# Patient Record
Sex: Female | Born: 1955 | Race: White | Hispanic: No | Marital: Married | Smoking: Never smoker
Health system: Southern US, Community
[De-identification: ages and names within clinical notes are randomized; demographics above are authoritative.]

## PROBLEM LIST (undated history)

## (undated) DIAGNOSIS — Z800 Family history of malignant neoplasm of digestive organs: Secondary | ICD-10-CM

## (undated) DIAGNOSIS — Z803 Family history of malignant neoplasm of breast: Secondary | ICD-10-CM

## (undated) DIAGNOSIS — Z801 Family history of malignant neoplasm of trachea, bronchus and lung: Secondary | ICD-10-CM

## (undated) DIAGNOSIS — C801 Malignant (primary) neoplasm, unspecified: Secondary | ICD-10-CM

## (undated) DIAGNOSIS — I10 Essential (primary) hypertension: Secondary | ICD-10-CM

## (undated) DIAGNOSIS — E785 Hyperlipidemia, unspecified: Secondary | ICD-10-CM

## (undated) DIAGNOSIS — F419 Anxiety disorder, unspecified: Secondary | ICD-10-CM

## (undated) HISTORY — DX: Family history of malignant neoplasm of breast: Z80.3

## (undated) HISTORY — DX: Family history of malignant neoplasm of digestive organs: Z80.0

## (undated) HISTORY — PX: CATARACT EXTRACTION, BILATERAL: SHX1313

## (undated) HISTORY — DX: Essential (primary) hypertension: I10

## (undated) HISTORY — DX: Family history of malignant neoplasm of trachea, bronchus and lung: Z80.1

## (undated) HISTORY — PX: ANTERIOR CRUCIATE LIGAMENT (ACL) REVISION: SHX6707

## (undated) HISTORY — PX: CHOLECYSTECTOMY: SHX55

---

## 2009-07-29 ENCOUNTER — Encounter: Admission: RE | Admit: 2009-07-29 | Discharge: 2009-07-29 | Payer: Self-pay | Admitting: Obstetrics

## 2010-03-21 ENCOUNTER — Emergency Department (HOSPITAL_COMMUNITY): Admission: EM | Admit: 2010-03-21 | Discharge: 2010-03-21 | Payer: Self-pay | Admitting: Family Medicine

## 2011-06-14 ENCOUNTER — Ambulatory Visit (HOSPITAL_BASED_OUTPATIENT_CLINIC_OR_DEPARTMENT_OTHER): Admission: RE | Admit: 2011-06-14 | Payer: Self-pay | Source: Ambulatory Visit | Admitting: Orthopedic Surgery

## 2011-12-08 ENCOUNTER — Other Ambulatory Visit: Payer: Self-pay | Admitting: Obstetrics

## 2011-12-08 DIAGNOSIS — Z1231 Encounter for screening mammogram for malignant neoplasm of breast: Secondary | ICD-10-CM

## 2012-01-02 ENCOUNTER — Ambulatory Visit: Payer: Self-pay

## 2014-02-05 ENCOUNTER — Other Ambulatory Visit: Payer: Self-pay

## 2014-02-05 DIAGNOSIS — Z1231 Encounter for screening mammogram for malignant neoplasm of breast: Secondary | ICD-10-CM

## 2014-02-23 ENCOUNTER — Ambulatory Visit
Admission: RE | Admit: 2014-02-23 | Discharge: 2014-02-23 | Disposition: A | Discharge status: Home / Self Care | Payer: 59 | Source: Ambulatory Visit

## 2014-02-23 DIAGNOSIS — Z1231 Encounter for screening mammogram for malignant neoplasm of breast: Secondary | ICD-10-CM

## 2015-06-07 ENCOUNTER — Telehealth: Payer: Self-pay | Admitting: Cardiovascular Disease

## 2015-06-07 NOTE — Telephone Encounter (Signed)
Received records from [facility] for appointment on 06/29/15 with Dr Duke Salvia.  Records given to [facility] (medical records) for Dr Leonides Sake schedule on 06/29/15.  lp

## 2015-06-29 ENCOUNTER — Ambulatory Visit: Payer: Self-pay | Admitting: Cardiovascular Disease

## 2015-07-14 ENCOUNTER — Ambulatory Visit: Payer: Self-pay | Admitting: Cardiovascular Disease

## 2016-06-03 ENCOUNTER — Telehealth: Payer: 59 | Admitting: Physician Assistant

## 2016-06-03 DIAGNOSIS — J019 Acute sinusitis, unspecified: Secondary | ICD-10-CM

## 2016-06-03 DIAGNOSIS — B9689 Other specified bacterial agents as the cause of diseases classified elsewhere: Secondary | ICD-10-CM

## 2016-06-03 MED ORDER — DOXYCYCLINE HYCLATE 100 MG PO CAPS: 100.0000 mg | ORAL_CAPSULE | Freq: Two times a day (BID) | ORAL | 0 refills | Status: DC

## 2016-06-03 NOTE — Progress Notes (Signed)

## 2016-06-23 ENCOUNTER — Telehealth: Payer: 59 | Admitting: Family

## 2016-06-23 DIAGNOSIS — B9689 Other specified bacterial agents as the cause of diseases classified elsewhere: Secondary | ICD-10-CM

## 2016-06-23 DIAGNOSIS — J028 Acute pharyngitis due to other specified organisms: Secondary | ICD-10-CM

## 2016-06-23 MED ORDER — AZITHROMYCIN 250 MG PO TABS: ORAL_TABLET | ORAL | 0 refills | Status: DC

## 2016-06-23 NOTE — Progress Notes (Signed)

## 2016-08-09 ENCOUNTER — Telehealth: Payer: 59 | Admitting: Nurse Practitioner

## 2016-08-09 DIAGNOSIS — R059 Cough, unspecified: Secondary | ICD-10-CM

## 2016-08-09 DIAGNOSIS — R05 Cough: Secondary | ICD-10-CM

## 2016-08-09 MED ORDER — AZITHROMYCIN 250 MG PO TABS: ORAL_TABLET | ORAL | 0 refills | Status: DC

## 2016-08-09 NOTE — Progress Notes (Signed)

## 2017-03-13 ENCOUNTER — Ambulatory Visit: Payer: 59 | Admitting: Podiatry

## 2017-04-18 ENCOUNTER — Telehealth: Payer: 59 | Admitting: Nurse Practitioner

## 2017-04-18 DIAGNOSIS — J019 Acute sinusitis, unspecified: Secondary | ICD-10-CM

## 2017-04-18 DIAGNOSIS — J0100 Acute maxillary sinusitis, unspecified: Secondary | ICD-10-CM

## 2017-04-18 DIAGNOSIS — J029 Acute pharyngitis, unspecified: Secondary | ICD-10-CM

## 2017-04-18 DIAGNOSIS — B9689 Other specified bacterial agents as the cause of diseases classified elsewhere: Secondary | ICD-10-CM

## 2017-04-18 MED ORDER — DOXYCYCLINE HYCLATE 100 MG PO CAPS: 100.0000 mg | ORAL_CAPSULE | Freq: Two times a day (BID) | ORAL | 0 refills | Status: DC

## 2017-04-18 NOTE — Addendum Note (Signed)
Addended by: Bennie PieriniMARTIN, MARY-MARGARET on: 04/18/2017 10:29 AM   Modules accepted: Orders

## 2017-04-18 NOTE — Progress Notes (Signed)
So sorry , but according to questionnaire it seemed you were concerned about sore throat. Below you will see care for sinusitis.  We are sorry that you are not feeling well.  Here is how we plan to help!  Based on what you have shared with me it looks like you have sinusitis.  Sinusitis is inflammation and infection in the sinus cavities of the head.  Based on your presentation I believe you most likely have Acute Bacterial Sinusitis.  This is an infection caused by bacteria and is treated with antibiotics. I have prescribed Doxycycline 100mg  by mouth twice a day for 10 days. You may use an oral decongestant such as Mucinex D or if you have glaucoma or high blood pressure use plain Mucinex. Saline nasal spray help and can safely be used as often as needed for congestion.  If you develop worsening sinus pain, fever or notice severe headache and vision changes, or if symptoms are not better after completion of antibiotic, please schedule an appointment with a health care provider.    Sinus infections are not as easily transmitted as other respiratory infection, however we still recommend that you avoid close contact with loved ones, especially the very young and elderly.  Remember to wash your hands thoroughly throughout the day as this is the number one way to prevent the spread of infection!  Home Care:  Only take medications as instructed by your medical team.  Complete the entire course of an antibiotic.  Do not take these medications with alcohol.  A steam or ultrasonic humidifier can help congestion.  You can place a towel over your head and breathe in the steam from hot water coming from a faucet.  Avoid close contacts especially the very young and the elderly.  Cover your mouth when you cough or sneeze.  Always remember to wash your hands.  Get Help Right Away If:  You develop worsening fever or sinus pain.  You develop a severe head ache or visual changes.  Your symptoms persist  after you have completed your treatment plan.  Make sure you  Understand these instructions.  Will watch your condition.  Will get help right away if you are not doing well or get worse.  Your e-visit answers were reviewed by a board certified advanced clinical practitioner to complete your personal care plan.  Depending on the condition, your plan could have included both over the counter or prescription medications.  If there is a problem please reply  once you have received a response from your provider.  Your safety is important to us.  If you have drug allergies check your prescription carefully.    You can use MyChart to ask questions about today's visit, request a non-urgent call back, or ask for a work or school excuse for 24 hours related to this e-Visit. If it has been greater than 24 hours you will need to follow up with your provider, or enter a new e-Visit to address those concerns.  You will get an e-mail in the next two days asking about your experience.  I hope that your e-visit has been valuable and will speed your recovery. Thank you for using e-visits.

## 2017-04-18 NOTE — Progress Notes (Signed)
We are sorry that you are not feeling well.  Here is how we plan to help!  Based on what you have shared with me it looks like you have  pharyngitis.   pharyngitis is inflammation  in the back of the throat.  This is an infection caused by a virus and will just run its course. Please keep in mind that this could last up to 10 days. You may use an oral throat lozenges as needed. Strep infections are not as easily transmitted as other respiratory infection, however we still recommend that you avoid close contact with loved ones, especially the very young and elderly.  Remember to wash your hands thoroughly throughout the day as this is the number one way to prevent the spread of infection!  Providers prescribe antibiotics to treat infections caused by bacteria. Antibiotics are very powerful in treating bacterial infections when they are used properly. To maintain their effectiveness, they should be used only when necessary. Overuse of antibiotics has resulted in the development of superbugs that are resistant to treatment!    After careful review of your answers, I would not recommend an antibiotic for your condition.  Antibiotics are not effective against viruses and therefore should not be used to treat them. Common examples of infections caused by viruses include colds and flu   Home Care:  Only take medications as instructed by your medical team.  Complete the entire course of an antibiotic.  Do not take these medications with alcohol.  A steam or ultrasonic humidifier can help congestion.  You can place a towel over your head and breathe in the steam from hot water coming from a faucet.  Avoid close contacts especially the very young and the elderly.  Cover your mouth when you cough or sneeze.  Always remember to wash your hands.  Get Help Right Away If:  You develop worsening fever or sinus pain.  You develop a severe head ache or visual changes.  Your symptoms persist after you  have completed your treatment plan.  Make sure you  Understand these instructions.  Will watch your condition.  Will get help right away if you are not doing well or get worse.  Your e-visit answers were reviewed by a board certified advanced clinical practitioner to complete your personal care plan.  Depending on the condition, your plan could have included both over the counter or prescription medications.  If there is a problem please reply  once you have received a response from your provider.  Your safety is important to us.  If you have drug allergies check your prescription carefully.    You can use MyChart to ask questions about today's visit, request a non-urgent call back, or ask for a work or school excuse for 24 hours related to this e-Visit. If it has been greater than 24 hours you will need to follow up with your provider, or enter a new e-Visit to address those concerns.  You will get an e-mail in the next two days asking about your experience.  I hope that your e-visit has been valuable and will speed your recovery. Thank you for using e-visits.             

## 2017-07-11 ENCOUNTER — Encounter: Payer: Self-pay | Admitting: Family Medicine

## 2018-09-05 ENCOUNTER — Telehealth: Payer: 59 | Admitting: Physician Assistant

## 2018-09-05 DIAGNOSIS — J029 Acute pharyngitis, unspecified: Secondary | ICD-10-CM

## 2018-09-05 MED ORDER — PREDNISONE 20 MG PO TABS: 40.0000 mg | ORAL_TABLET | Freq: Every day | ORAL | 0 refills | Status: DC

## 2018-09-05 MED ORDER — AZITHROMYCIN 250 MG PO TABS: ORAL_TABLET | ORAL | 0 refills | Status: DC

## 2018-09-05 NOTE — Progress Notes (Signed)
Addendum:   Multiple messages back and forth with patient concerning request for antibiotic. Additional symptoms expressed by patient with review of her history of respiratory infections. Did provider z-pack with instructions on monitoring symptoms for 48 hours and only to start abx if symptoms continue to worsen or are not starting to ease up some in that time frame.

## 2018-09-05 NOTE — Progress Notes (Signed)
We are sorry that you are not feeling well.  Here is how we plan to help!  Your symptoms indicate a likely viral infection (Pharyngitis).   Pharyngitis is inflammation in the back of the throat which can cause a sore throat, scratchiness and sometimes difficulty swallowing.   Pharyngitis is typically caused by a respiratory virus and will just run its course.  Please keep in mind that your symptoms could last up to 10 days.  For throat pain, we recommend over the counter oral pain relief medications such as acetaminophen. Giving severity of pain I am sending in a small burst of steroid to alleviate pain and swelling. Topical treatments such as oral throat lozenges or sprays may be used as needed.  Avoid close contact with loved ones, especially the very young and elderly.  Remember to wash your hands thoroughly throughout the day as this is the number one way to prevent the spread of infection and wipe down door knobs and counters with disinfectants.  After careful review of your answers, I would not recommend and antibiotic for your condition.  Antibiotics should not be used to treat conditions that we suspect are caused by viruses like the virus that causes the common cold or flu. However, some people can have Strep with atypical symptoms. You may need formal testing in clinic or office to confirm if your symptoms continue or worsen.  Providers prescribe antibiotics to treat infections caused by bacteria. Antibiotics are very powerful in treating bacterial infections when they are used properly.  To maintain their effectiveness, they should be used only when necessary.  Overuse of antibiotics has resulted in the development of super bugs that are resistant to treatment!    Home Care:  Only take medications as instructed by your medical team.  Do not drink alcohol while taking these medications.  A steam or ultrasonic humidifier can help congestion.  You can place a towel over your head and breathe in  the steam from hot water coming from a faucet.  Avoid close contacts especially the very young and the elderly.  Cover your mouth when you cough or sneeze.  Always remember to wash your hands.  Get Help Right Away If:  You develop worsening fever or throat pain.  You develop a severe head ache or visual changes.  Your symptoms persist after you have completed your treatment plan.  Make sure you  Understand these instructions.  Will watch your condition.  Will get help right away if you are not doing well or get worse.  Your e-visit answers were reviewed by a board certified advanced clinical practitioner to complete your personal care plan.  Depending on the condition, your plan could have included both over the counter or prescription medications.  If there is a problem please reply  once you have received a response from your provider.  Your safety is important to us.  If you have drug allergies check your prescription carefully.    You can use MyChart to ask questions about todays visit, request a non-urgent call back, or ask for a work or school excuse for 24 hours related to this e-Visit. If it has been greater than 24 hours you will need to follow up with your provider, or enter a new e-Visit to address those concerns.  You will get an e-mail in the next two days asking about your experience.  I hope that your e-visit has been valuable and will speed your recovery. Thank you for using e-visits.

## 2018-09-05 NOTE — Addendum Note (Signed)
Addended by: Waldon MerlMARTIN, Eyal Greenhaw C on: 09/05/2018 07:46 PM   Modules accepted: Orders

## 2019-12-15 ENCOUNTER — Other Ambulatory Visit: Payer: Self-pay | Admitting: Orthopedic Surgery

## 2019-12-15 DIAGNOSIS — M25562 Pain in left knee: Secondary | ICD-10-CM

## 2019-12-18 ENCOUNTER — Ambulatory Visit
Admission: RE | Admit: 2019-12-18 | Discharge: 2019-12-18 | Disposition: A | Discharge status: Home / Self Care | Payer: Medicare Other | Source: Ambulatory Visit | Attending: Orthopedic Surgery | Admitting: Orthopedic Surgery

## 2019-12-18 ENCOUNTER — Other Ambulatory Visit: Payer: Self-pay

## 2019-12-18 DIAGNOSIS — M25562 Pain in left knee: Secondary | ICD-10-CM

## 2020-09-15 ENCOUNTER — Other Ambulatory Visit: Payer: Self-pay | Admitting: Family Medicine

## 2020-09-15 DIAGNOSIS — M25562 Pain in left knee: Secondary | ICD-10-CM

## 2020-09-16 ENCOUNTER — Ambulatory Visit
Admission: RE | Admit: 2020-09-16 | Discharge: 2020-09-16 | Disposition: A | Discharge status: Home / Self Care | Payer: Medicare Other | Source: Ambulatory Visit | Attending: Family Medicine | Admitting: Family Medicine

## 2020-09-16 DIAGNOSIS — M25562 Pain in left knee: Secondary | ICD-10-CM

## 2021-04-21 LAB — COLOGUARD: COLOGUARD: NEGATIVE

## 2022-01-22 IMAGING — MR MR KNEE*L* W/O CM
4 of 6 series · 23 of 40 positions shown · non-contrast
Comparison: MRI left knee 12/18/2019.

CLINICAL DATA: Medial left knee pain for 1 year. History of
multiple falls.

EXAM:
MRI OF THE LEFT KNEE WITHOUT CONTRAST
TECHNIQUE: Multiplanar, multisequence MR imaging of the knee was performed. No
intravenous contrast was administered.

[Series 4: T2 fat-sat · coronal · 4.0mm · 0.59mm/px · 6 of 29 slices shown (1 of 2)]
[im 1/29]
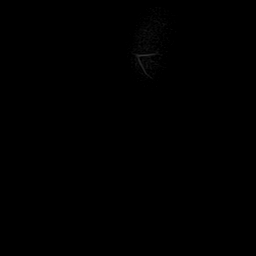
[im 6/29]
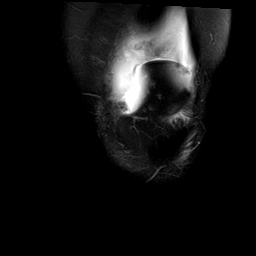
[im 12/29]
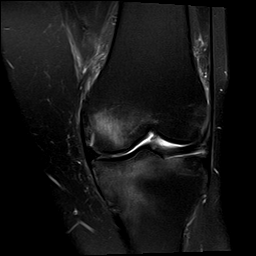
[im 17/29]
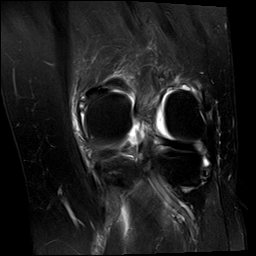
[im 23/29]
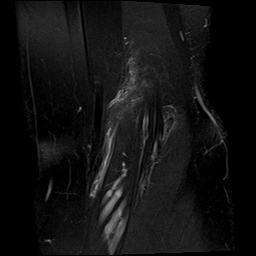
[im 29/29]
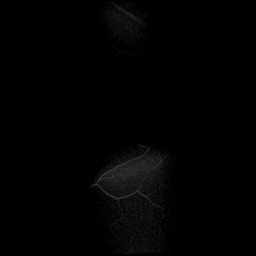

[Series 5: T1 · coronal · 4.0mm · 0.29mm/px · 3 of 29 slices shown]
[im 6/29]
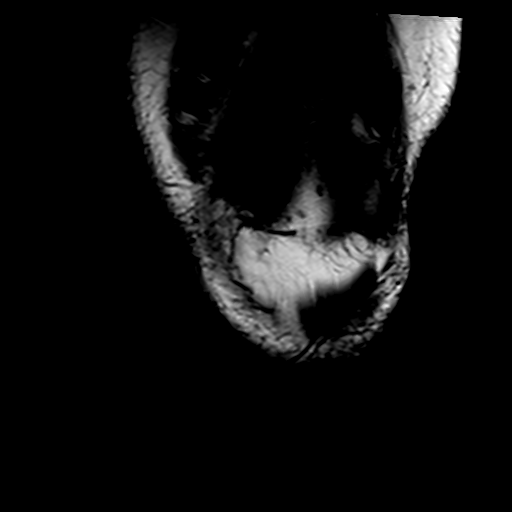
[im 17/29]
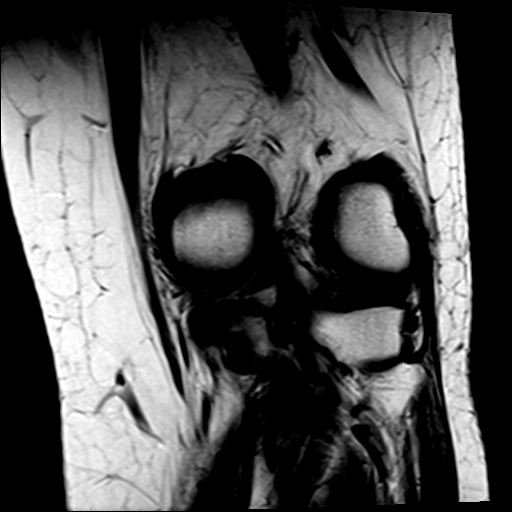
[im 29/29]
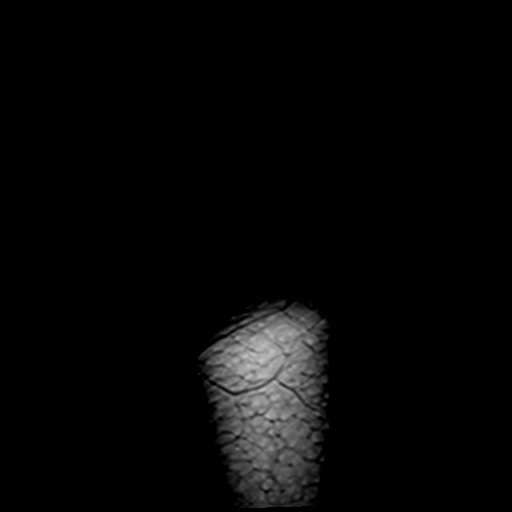

[Series 7: PD fat-sat · sagittal · 3.0mm · 0.29mm/px · 7 of 30 slices shown]
[im 1/30]
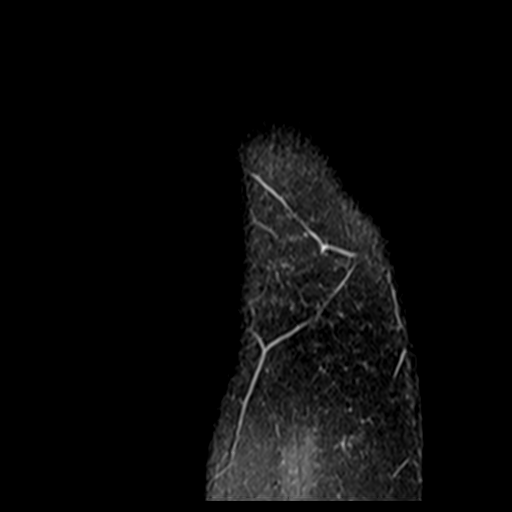
[im 5/30]
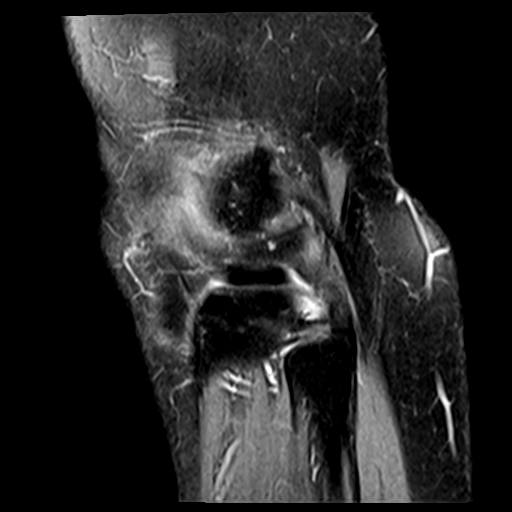
[im 10/30]
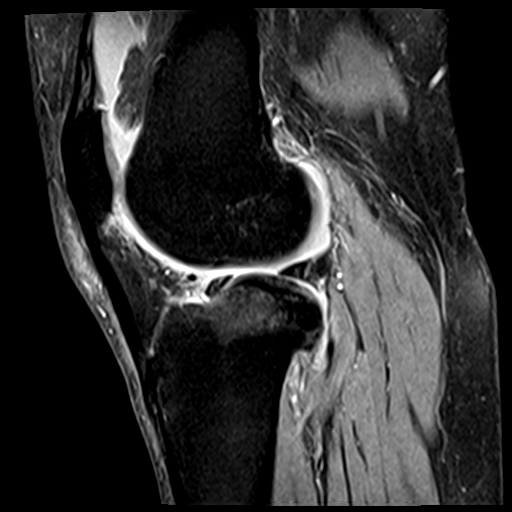
[im 15/30]
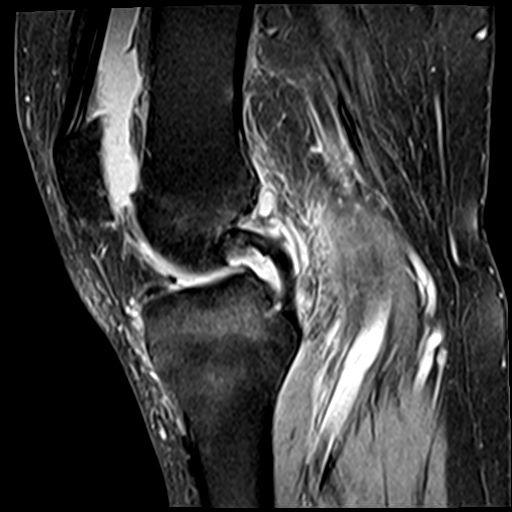
[im 20/30]
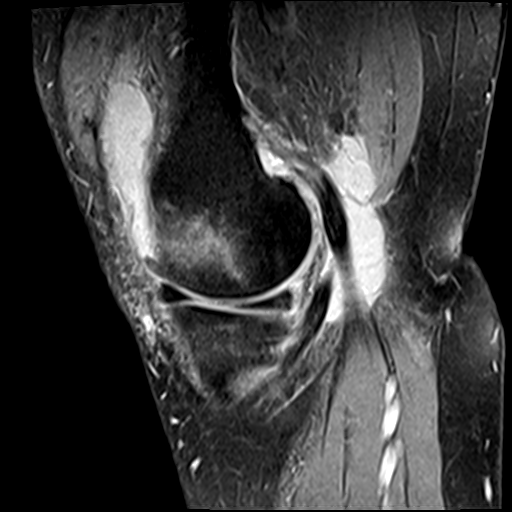
[im 25/30]
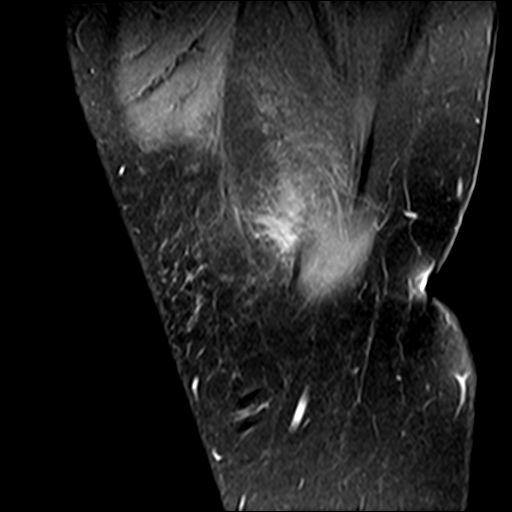
[im 30/30]
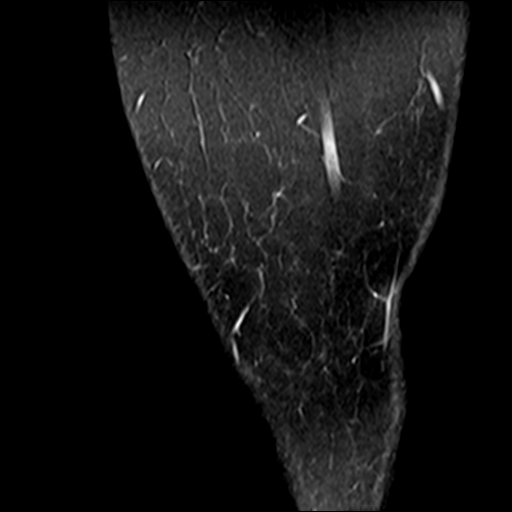

[Series 8: T2 fat-sat · sagittal · 3.0mm · 0.29mm/px · 7 of 30 slices shown (2 of 2)]
[im 1/30]
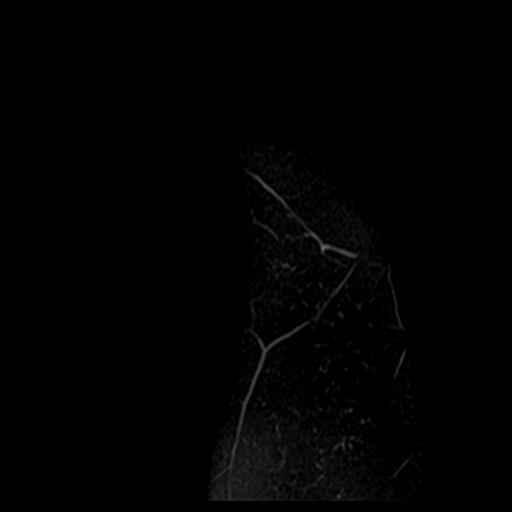
[im 5/30]
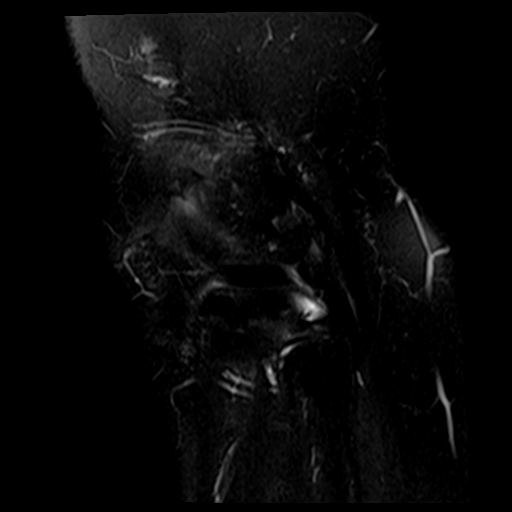
[im 10/30]
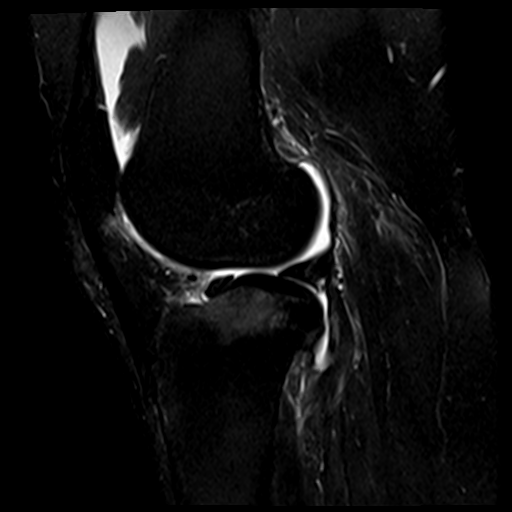
[im 15/30]
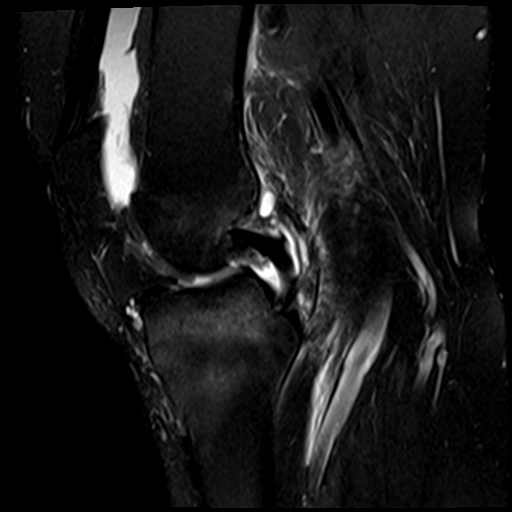
[im 20/30]
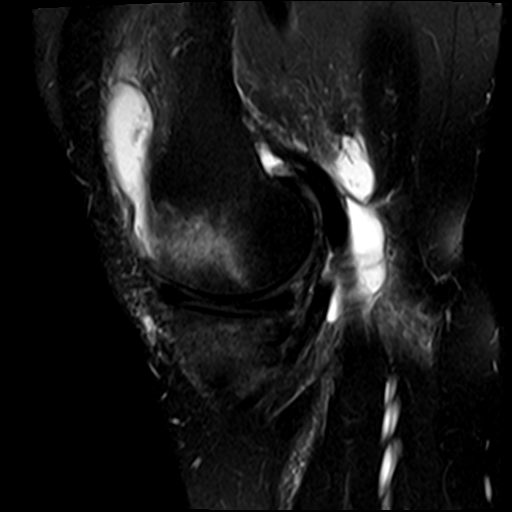
[im 25/30]
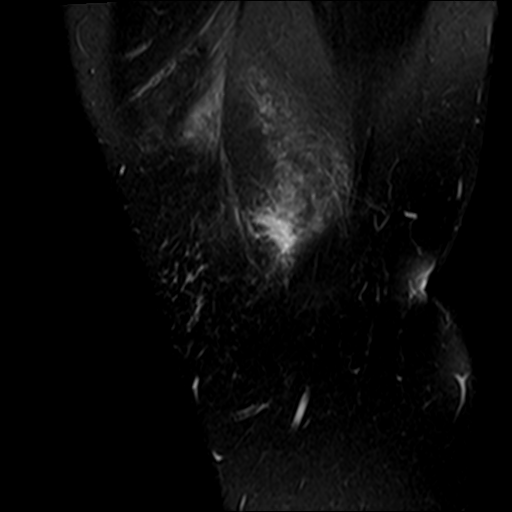
[im 30/30]
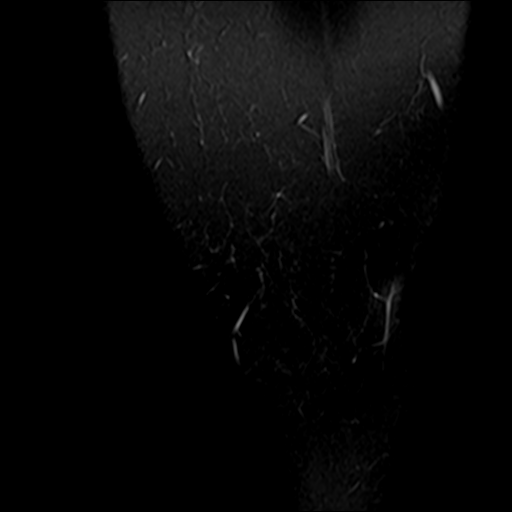

[23 of 40 positions shown; findings below may reference images not displayed]

FINDINGS: MENISCI

Medial meniscus: Since the prior MRI, the patient has developed a
radial tear in the posterior horn with a gap between meniscal
fragments of approximately 0.7 cm. The root of the posterior horn is
edematous but appears intact.

Lateral meniscus:  Intact.

LIGAMENTS

Cruciates:  Intact.

Collaterals:  Intact.

CARTILAGE

Patellofemoral: Again seen is denuding of hyaline cartilage along
the medial patellar facet and femoral trochlea.

Medial: Cartilage loss has markedly progressed since the prior MRI.
There is a full-thickness defect along the central weight-bearing
medial femoral condyle measuring 0.7 cm transverse by 0.7 cm AP.

Lateral:  Mildly degenerated.

Joint:  Moderate joint effusion.

Popliteal Fossa: Baker's cyst measures 3 cm transverse by 1 cm AP by
5.5 cm craniocaudal compared to 2 cm transverse by 0.9 cm AP by
cm craniocaudal on the prior exam.

Extensor Mechanism:  Intact.

Bones: There is new subchondral edema about the knee which is worst
in the medial compartment. Small incomplete and nondisplaced stress
fracture in the medial tibial metaphysis is new since the prior
study.

Other: None.
IMPRESSION: New large radial tear in the posterior horn of the medial meniscus.

New subchondral edema about the medial and lateral compartments is
worse on the medial side. The patient has an incomplete and
nondisplaced stress fracture of the medial tibial metaphysis.

Worsened osteoarthritis in the medial compartment.

Baker's cyst seen on the prior study has increased in size.

## 2022-08-04 NOTE — Progress Notes (Unsigned)
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  Not associated with increased frequency.  Neurologic exam unremarkable.   History of cerebral meningioma resection Hypertension  No further workup recommended. Contact PCP's today office regarding blood pressure   Subjective:  Katelyn Rich is a 66 year old right-handed female with hyperlipidemia who presents for atypical headache and meningoma.  History supplemented by referring provider's note.  She has had ocular migraines since she was around 66 years old.  They present as either a blind spot in her vision or seeing zigzag lines.  Usually lasts a couple of minutes.  No associated headache.  She would typically take a Fioricet.  Initially they would occur once a week or maybe once a month.  As she got older, they became less frequent, occurring every few years.  About 3 weeks ago, she had an ocular migraine with the blind spot.  The next morning, she had another one, presenting with the zigzag lines.  She never had one back to back.  She hasn't had another one since then.  At the time, she was taking tramadol for knee pain.  She looked it up and saw headaches may be a side effect, so she stopped the tramadol.    She has history of meningioma resection around 2008 (found incidentally on brain imaging after she hit her head).  Last available MRI of brain with and without contrast on 07/09/2014 revealed left frontal craniotomy with minimal postsurgical changes but no evidence of residual or recurrent meningioma.  Eventually, her neurosurgeon told her further repeat MRIs no longer indicated.    Past NSAIDS/analgesics:  tramadol PRN  Past abortive triptans:  none Past abortive ergotamine:  none Past muscle relaxants:   none Past anti-emetic:  none Past antihypertensive medications:  none Past antidepressant medications:  none Past anticonvulsant medications:  none Past anti-CGRP:  none Other past therapies:  none  Current NSAIDS/analgesics:  none Current triptans:  none Current ergotamine:  none Current anti-emetic:  none Current muscle relaxants:  none Current Antihypertensive medications:  amlodipine 2.5mg  daily, olmesartan Current Antidepressant medications:  none Current Anticonvulsant medications:  none Current anti-CGRP:  none Other medications:  alprazolam       PAST MEDICAL HISTORY: Past Medical History:  Diagnosis Date   Hypertension     PAST SURGICAL HISTORY: Meningioma resection  MEDICATIONS: Current Outpatient Medications on File Prior to Visit  Medication Sig Dispense Refill   ALPRAZolam (XANAX) 0.5 MG tablet Take 0.5 mg by mouth at bedtime as needed.     amLODipine (NORVASC) 2.5 MG tablet Take 1 tablet by mouth daily.     diclofenac Sodium (VOLTAREN) 1 % GEL Apply 2 g topically 4 (four) times daily.     olmesartan (BENICAR) 40 MG tablet Take 1 tablet by mouth daily.     traMADol (ULTRAM) 50 MG tablet Take 1 tablet by mouth every 8 (eight) hours as needed.     No current facility-administered medications on file prior to visit.      ALLERGIES: Allergies  Allergen Reactions   Penicillins Anaphylaxis    FAMILY HISTORY: No family history on file.  Objective:  Blood pressure (!) 191/95, pulse 61, height 5\' 5"  (1.651 m), weight 183 lb 12.8 oz (83.4 kg), SpO2 98 %. General: No acute distress.  Patient appears well-groomed.   Head:  Normocephalic/atraumatic Eyes:  fundi examined but not visualized Neck: supple, no paraspinal tenderness, full range of motion Back: No paraspinal tenderness Heart: regular rate and rhythm Lungs: Clear to auscultation bilaterally. Vascular: No carotid bruits. Neurological Exam: Mental status: alert and oriented to person, place,  and time, speech fluent and not dysarthric, language intact. Cranial nerves: CN I: not tested CN II: pupils equal, round and reactive to light, visual fields intact CN III, IV, VI:  full range of motion, no nystagmus, no ptosis CN V: facial sensation intact. CN VII: upper and lower face symmetric CN VIII: hearing intact CN IX, X: gag intact, uvula midline CN XI: sternocleidomastoid and trapezius muscles intact CN XII: tongue midline Bulk & Tone: normal, no fasciculations. Motor:  muscle strength 5/5 throughout Sensation:  Temperature and vibratory sensation intact. Deep Tendon Reflexes:  2+ throughout,  toes downgoing.   Finger to nose testing:  Without dysmetria.   Heel to shin:  Without dysmetria.   Gait:  Normal station and stride.  Romberg negative.    Thank you for allowing me to take part in the care of this patient.  Metta Clines, DO  CC: Shanon Rosser, PA-C

## 2022-08-07 ENCOUNTER — Encounter: Payer: Self-pay | Admitting: Neurology

## 2022-08-07 ENCOUNTER — Ambulatory Visit (INDEPENDENT_AMBULATORY_CARE_PROVIDER_SITE_OTHER): Payer: Medicare Other | Admitting: Neurology

## 2022-08-07 VITALS — BP 191/95 | HR 61 | Ht 65.0 in | Wt 183.8 lb

## 2022-08-07 DIAGNOSIS — Z9889 Other specified postprocedural states: Secondary | ICD-10-CM | POA: Diagnosis not present

## 2022-08-07 DIAGNOSIS — I10 Essential (primary) hypertension: Secondary | ICD-10-CM | POA: Diagnosis not present

## 2022-08-07 DIAGNOSIS — G43109 Migraine with aura, not intractable, without status migrainosus: Secondary | ICD-10-CM

## 2022-08-07 DIAGNOSIS — Z8603 Personal history of neoplasm of uncertain behavior: Secondary | ICD-10-CM

## 2024-06-01 ENCOUNTER — Emergency Department (HOSPITAL_BASED_OUTPATIENT_CLINIC_OR_DEPARTMENT_OTHER)
Admission: EM | Admit: 2024-06-01 | Discharge: 2024-06-01 | Disposition: A | Discharge status: Home / Self Care | Attending: Emergency Medicine | Admitting: Emergency Medicine

## 2024-06-01 ENCOUNTER — Emergency Department (HOSPITAL_BASED_OUTPATIENT_CLINIC_OR_DEPARTMENT_OTHER)

## 2024-06-01 ENCOUNTER — Other Ambulatory Visit: Payer: Self-pay

## 2024-06-01 ENCOUNTER — Encounter (HOSPITAL_BASED_OUTPATIENT_CLINIC_OR_DEPARTMENT_OTHER): Payer: Self-pay

## 2024-06-01 DIAGNOSIS — W19XXXA Unspecified fall, initial encounter: Secondary | ICD-10-CM

## 2024-06-01 DIAGNOSIS — W01198A Fall on same level from slipping, tripping and stumbling with subsequent striking against other object, initial encounter: Secondary | ICD-10-CM | POA: Insufficient documentation

## 2024-06-01 DIAGNOSIS — S0990XA Unspecified injury of head, initial encounter: Secondary | ICD-10-CM | POA: Diagnosis present

## 2024-06-01 DIAGNOSIS — S0101XA Laceration without foreign body of scalp, initial encounter: Secondary | ICD-10-CM | POA: Insufficient documentation

## 2024-06-01 MED ORDER — TETANUS-DIPHTH-ACELL PERTUSSIS 5-2.5-18.5 LF-MCG/0.5 IM SUSY
0.5000 mL | PREFILLED_SYRINGE | Freq: Once | INTRAMUSCULAR | Status: DC
  Filled 2024-06-01: qty 0.5

## 2024-06-01 MED ORDER — LIDOCAINE-EPINEPHRINE-TETRACAINE (LET) TOPICAL GEL
3.0000 mL | Freq: Once | TOPICAL | Status: AC
  Administered 2024-06-01: 3 mL via TOPICAL
  Filled 2024-06-01: qty 3

## 2024-06-01 NOTE — ED Triage Notes (Signed)
 Pt reports tripping and falling, striking her head. Pt denies any LOC. Pt denies any blood thinners. Pt has 2 cm laceration to scalp.

## 2024-06-01 NOTE — ED Provider Notes (Signed)
 [place] [facility]PARTMENT AT [facility] Provider Note   CSN: [id number] Arrival date & time: 06/01/24  1629     Patient presents with: Katelyn Rich   Katelyn Rich is a 68 y.o. female here for evaluation mechanical fall.  Tripped and fell hit the frontal aspect of her head.  Suffered a 2 cm laceration.  Her tetanus is up-to-date.  No nausea, vomiting, LOC, anticoagulation.  Was able to ambulate after fall.  No pain to neck, back, chest, abdomen, upper or lower extremities.   HPI     Prior to Admission medications   Medication Sig Start Date End Date Taking? Authorizing Provider  ALPRAZolam (XANAX) 0.5 MG tablet Take 0.5 mg by mouth at bedtime as needed.    [provider]  amLODipine (NORVASC) 2.5 MG tablet Take 1 tablet by mouth daily.    [provider]  diclofenac Sodium (VOLTAREN) 1 % GEL Apply 2 g topically 4 (four) times daily.    [provider]  olmesartan (BENICAR) 40 MG tablet Take 1 tablet by mouth daily.    [provider]  traMADol (ULTRAM) 50 MG tablet Take 1 tablet by mouth every 8 (eight) hours as needed.    [provider]    Allergies: Penicillins    Review of Systems  Constitutional: Negative.   HENT: Negative.    Respiratory: Negative.    Cardiovascular: Negative.   Gastrointestinal: Negative.   Genitourinary: Negative.   Musculoskeletal: Negative.   Skin:  Positive for wound.  Neurological: Negative.   All other systems reviewed and are negative.   Updated Vital Signs BP (!) 153/81 (BP Location: Right Arm)   Pulse 64   Temp 97.8 F (36.6 C) (Temporal)   Resp 18   Ht 5' 5 (1.651 m)   Wt 79.4 kg   SpO2 99%   BMI 29.12 kg/m   Physical Exam Vitals and nursing note reviewed.  Constitutional:      General: She is not in acute distress.    Appearance: She is well-developed. She is not ill-appearing, toxic-appearing or diaphoretic.  HENT:     Head: Normocephalic.     Jaw: There is normal  jaw occlusion.     Comments: No raccoon eyes, Battle sign, hematoma. 3 cm laceration just posterior to hairline on head.    Mouth/Throat:     Mouth: Mucous membranes are moist.  Eyes:     Pupils: Pupils are equal, round, and reactive to light.  Neck:     Trachea: Trachea and phonation normal.     Comments: No midline tenderness Cardiovascular:     Rate and Rhythm: Normal rate.     Pulses: Normal pulses.     Heart sounds: Normal heart sounds.  Pulmonary:     Effort: Pulmonary effort is normal. No respiratory distress.     Breath sounds: Normal breath sounds.  Abdominal:     General: Bowel sounds are normal. There is no distension.     Palpations: Abdomen is soft.  Musculoskeletal:        General: Normal range of motion.     Cervical back: Full passive range of motion without pain and normal range of motion.     Comments: Nontender upper or lower extremities, full range of motion, no midline C/T/L tenderness  Skin:    General: Skin is warm and dry.     Capillary Refill: Capillary refill takes less than 2 seconds.     Comments: 3 cm laceration scalp, no active  bleed  Neurological:     General: No focal deficit present.     Mental Status: She is alert.  Psychiatric:        Mood and Affect: Mood normal.     (all labs ordered are listed, but only abnormal results are displayed) Labs Reviewed - No data to display  EKG: None  Radiology: CT Head Wo Contrast Result Date: 06/01/2024 CLINICAL DATA:  Polytrauma, blunt EXAM: CT HEAD WITHOUT CONTRAST CT CERVICAL SPINE WITHOUT CONTRAST TECHNIQUE: Multidetector CT imaging of the head and cervical spine was performed following the standard protocol without intravenous contrast. Multiplanar CT image reconstructions of the cervical spine were also generated. RADIATION DOSE REDUCTION: This exam was performed according to the departmental dose-optimization program which includes automated exposure control, adjustment of the mA and/or kV  according to patient size and/or use of iterative reconstruction technique. COMPARISON:  None Available. FINDINGS: CT HEAD FINDINGS Brain: Left frontal lobe encephalomalacia. No evidence of large-territorial acute infarction. No parenchymal hemorrhage. No mass lesion. No extra-axial collection. No mass effect or midline shift. No hydrocephalus. Basilar cisterns are patent. Vascular: No hyperdense vessel. Skull: No acute fracture or focal lesion.  Bilateral craniotomy. Sinuses/Orbits: Paranasal sinuses and mastoid air cells are clear. Bilateral lens replacement. Otherwise the orbits are unremarkable. Other: None. CT CERVICAL SPINE FINDINGS Alignment: Normal. Skull base and vertebrae: Multilevel moderate degenerative change of the spine. No associated severe osseous neural foraminal or central canal stenosis. No acute fracture. No aggressive appearing focal osseous lesion or focal pathologic process. Soft tissues and spinal canal: No prevertebral fluid or swelling. No visible canal hematoma. Upper chest: Unremarkable. Other: Heterogeneous thyroid glands. IMPRESSION: 1. No acute intracranial abnormality. 2. No acute displaced fracture or traumatic listhesis of the cervical spine. Electronically Signed   By: Morgane  Naveau M.D.   On: 06/01/2024 17:56   CT Cervical Spine Wo Contrast Result Date: 06/01/2024 CLINICAL DATA:  Polytrauma, blunt EXAM: CT HEAD WITHOUT CONTRAST CT CERVICAL SPINE WITHOUT CONTRAST TECHNIQUE: Multidetector CT imaging of the head and cervical spine was performed following the standard protocol without intravenous contrast. Multiplanar CT image reconstructions of the cervical spine were also generated. RADIATION DOSE REDUCTION: This exam was performed according to the departmental dose-optimization program which includes automated exposure control, adjustment of the mA and/or kV according to patient size and/or use of iterative reconstruction technique. COMPARISON:  None Available. FINDINGS: CT  HEAD FINDINGS Brain: Left frontal lobe encephalomalacia. No evidence of large-territorial acute infarction. No parenchymal hemorrhage. No mass lesion. No extra-axial collection. No mass effect or midline shift. No hydrocephalus. Basilar cisterns are patent. Vascular: No hyperdense vessel. Skull: No acute fracture or focal lesion.  Bilateral craniotomy. Sinuses/Orbits: Paranasal sinuses and mastoid air cells are clear. Bilateral lens replacement. Otherwise the orbits are unremarkable. Other: None. CT CERVICAL SPINE FINDINGS Alignment: Normal. Skull base and vertebrae: Multilevel moderate degenerative change of the spine. No associated severe osseous neural foraminal or central canal stenosis. No acute fracture. No aggressive appearing focal osseous lesion or focal pathologic process. Soft tissues and spinal canal: No prevertebral fluid or swelling. No visible canal hematoma. Upper chest: Unremarkable. Other: Heterogeneous thyroid glands. IMPRESSION: 1. No acute intracranial abnormality. 2. No acute displaced fracture or traumatic listhesis of the cervical spine. Electronically Signed   By: Morgane  Naveau M.D.   On: 06/01/2024 17:56     .Laceration Repair  Date/Time: 06/01/2024 6:56 PM  Performed by: Edie Rosebud LABOR, PA-C Authorized by: Edie Rosebud LABOR, PA-C   Consent:  Consent obtained:  Verbal   Consent given by:  Patient   Risks, benefits, and alternatives were discussed: yes     Risks discussed:  Infection, pain, retained foreign body, tendon damage, vascular damage, poor wound healing, poor cosmetic result, need for additional repair and nerve damage   Alternatives discussed:  No treatment, delayed treatment, observation and referral Universal protocol:    Procedure explained and questions answered to patient or proxy's satisfaction: yes     Relevant documents present and verified: yes     Test results available: yes     Imaging studies available: yes     Required blood products,  implants, devices, and special equipment available: yes     Site/side marked: yes     Immediately prior to procedure, a time out was called: yes     Patient identity confirmed:  Verbally with patient Anesthesia:    Anesthesia method:  Topical application   Topical anesthetic:  LET Laceration details:    Location:  Scalp   Scalp location:  Frontal   Length (cm):  2   Depth (mm):  4 Pre-procedure details:    Preparation:  Patient was prepped and draped in usual sterile fashion and imaging obtained to evaluate for foreign bodies Exploration:    Limited defect created (wound extended): no     Hemostasis achieved with:  Direct pressure   Imaging obtained comment:  CT   Wound extent: fascia not violated, no foreign body, no signs of injury, no tendon damage, no underlying fracture and no vascular damage     Contaminated: no   Treatment:    Area cleansed with:  Soap and water Skin repair:    Repair method:  Staples   Number of staples:  2 Approximation:    Approximation:  Close Repair type:    Repair type:  Intermediate Post-procedure details:    Dressing:  Open (no dressing)   Procedure completion:  Tolerated well, no immediate complications    Medications Ordered in the ED  Tdap (BOOSTRIX ) injection 0.5 mL (0.5 mLs Intramuscular Patient Refused/Not Given 06/01/24 1809)  lidocaine -EPINEPHrine -tetracaine  (LET) topical gel (3 mLs Topical Given 06/01/24 3239)   68 year old here for evaluation after mechanical fall.  She has a 3 cm laceration to scalp.  No active bleeding.  No LOC or anticoagulation.  Amatory after fall.  Neurovascular intact.  Nontender upper lower extremities.  No syncope.  Will plan on imaging, closure and reassess  Imaging personally viewed and interpreted:  CT head, cervical no significant abnormality  Discussed results with patient, family in room.  Is able to place 2 staples for scalp wound.  Discussed close follow-up outpatient, return for any worsening  symptom  The patient has been appropriately medically screened and/or stabilized in the ED. I have low suspicion for any other emergent medical condition which would require further screening, evaluation or treatment in the ED or require inpatient management.  Patient is hemodynamically stable and in no acute distress.  Patient able to ambulate in department prior to ED.  Evaluation does not show acute pathology that would require ongoing or additional emergent interventions while in the emergency department or further inpatient treatment.  I have discussed the diagnosis with the patient and answered all questions.  Pain is been managed while in the emergency department and patient has no further complaints prior to discharge.  Patient is comfortable with plan discussed in room and is stable for discharge at this time.  I have discussed strict return precautions  for returning to the emergency department.  Patient was encouraged to follow-up with PCP/specialist refer to at discharge.                                    Medical Decision Making Amount and/or Complexity of Data Reviewed Independent Historian: spouse External Data Reviewed: labs, radiology and notes. Radiology: ordered and independent interpretation performed. Decision-making details documented in ED Course.  Risk OTC drugs. Prescription drug management. Decision regarding hospitalization. Diagnosis or treatment significantly limited by social determinants of health.       Final diagnoses:  Fall, initial encounter  Laceration of scalp, initial encounter    ED Discharge Orders     None          Aanyah Loa A, PA-C 06/01/24 2329    Ruthe Cornet, DO 06/04/24 1449

## 2024-06-01 NOTE — ED Notes (Signed)
 DC paperwork given and verbally understood.

## 2024-06-01 NOTE — Discharge Instructions (Signed)
 You have two staples in your head.  Follow-up in 1 week for removal.  Otherwise Tylenol Motrin as needed for pain  Do not wash your hair for 48 hours after that you may let warm soapy water run over the area  Return for new or worsening symptoms

## 2024-06-01 NOTE — ED Notes (Signed)
 Provider aware of the LET being given.

## 2024-06-11 ENCOUNTER — Other Ambulatory Visit: Payer: Self-pay | Admitting: Physician Assistant

## 2024-06-11 DIAGNOSIS — Z1231 Encounter for screening mammogram for malignant neoplasm of breast: Secondary | ICD-10-CM

## 2024-07-22 ENCOUNTER — Other Ambulatory Visit: Payer: Self-pay | Admitting: Obstetrics

## 2024-07-22 DIAGNOSIS — R928 Other abnormal and inconclusive findings on diagnostic imaging of breast: Secondary | ICD-10-CM

## 2024-08-02 ENCOUNTER — Other Ambulatory Visit: Payer: Self-pay | Admitting: Obstetrics

## 2024-08-02 ENCOUNTER — Ambulatory Visit
Admission: RE | Admit: 2024-08-02 | Discharge: 2024-08-02 | Disposition: A | Discharge status: Home / Self Care | Source: Ambulatory Visit | Attending: Obstetrics | Admitting: Obstetrics

## 2024-08-02 DIAGNOSIS — R928 Other abnormal and inconclusive findings on diagnostic imaging of breast: Secondary | ICD-10-CM

## 2024-08-02 DIAGNOSIS — N6311 Unspecified lump in the right breast, upper outer quadrant: Secondary | ICD-10-CM

## 2024-08-04 ENCOUNTER — Ambulatory Visit
Admission: RE | Admit: 2024-08-04 | Discharge: 2024-08-04 | Disposition: A | Discharge status: Home / Self Care | Source: Ambulatory Visit | Attending: Obstetrics | Admitting: Obstetrics

## 2024-08-04 DIAGNOSIS — N6311 Unspecified lump in the right breast, upper outer quadrant: Secondary | ICD-10-CM

## 2024-08-04 HISTORY — PX: BREAST BIOPSY: SHX20

## 2024-08-05 LAB — SURGICAL PATHOLOGY

## 2024-08-11 ENCOUNTER — Encounter: Payer: Self-pay | Admitting: *Deleted

## 2024-08-11 DIAGNOSIS — C50411 Malignant neoplasm of upper-outer quadrant of right female breast: Secondary | ICD-10-CM | POA: Insufficient documentation

## 2024-08-12 NOTE — Progress Notes (Signed)
 Radiation Oncology         [phone]) [phone] ________________________________  Name: Katelyn Rich        MRN: [id number]  Date of Service: 08/13/2024 DOB: 1956-04-10  RR:Onwh, Scott, PA-C  Vanderbilt Ned, MD     REFERRING PHYSICIAN: Vanderbilt Ned, MD   DIAGNOSIS: The encounter diagnosis was Malignant neoplasm of upper-outer quadrant of right breast in female, estrogen receptor positive (HCC). C50.411, Z17.0    HISTORY OF PRESENT ILLNESS: Katelyn Rich is a 68 y.o. female seen in consultation for radiation therapy.  Ms. Obed presented with a spiculated mass seen on screening mammography. Subsequent 08/02/24 unilateral diagnostic mammogram revealed a 0.6 x 0.6 x 0.4 cm mass w/ ill-defined borders at 10 o'clock in the UOQ of the R breast. A second suspicious part cystic 5 mm mass was seen at 11 o'clock. No abnormal lymph nodes seen within the R axilla. 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 The 11 o'clock lesion was negative for malignancy and consistent with fibrocystic changes w/ usual ductal hyperplasia.  Estrogen exposure history:  Childbearing/breastfeeding:   Family history of cancer:  PREVIOUS RADIATION THERAPY: {EXAM; YES/NO:19492::No}  AUTOIMMUNE DISEASE: {EXAM; YES/NO:19492::No}  MEDICAL DEVICES: {EXAM; YES/NO:19492::No}  PREGNANCY: {EXAM; YES/NO:19492::No}   PAST MEDICAL HISTORY:  Past Medical History:  Diagnosis Date   Hypertension        PAST SURGICAL HISTORY: Past Surgical History:  Procedure Laterality Date   ANTERIOR CRUCIATE LIGAMENT (ACL) REVISION     BREAST BIOPSY Right 08/04/2024   US  RT BREAST BX W LOC DEV 1ST LESION IMG BX SPEC US  GUIDE 08/04/2024 [facility]   BREAST BIOPSY Right 08/04/2024   US  RT BREAST BX W LOC DEV EA ADD LESION IMG BX SPEC US  GUIDE 08/04/2024 [facility]   CATARACT EXTRACTION, BILATERAL      CHOLECYSTECTOMY       FAMILY HISTORY:  Family History  Problem Relation Age of Onset   Dementia Mother    Migraines Daughter      SOCIAL HISTORY:  reports that she has never smoked. She has never used smokeless tobacco. She reports current alcohol use of about 4.0 standard drinks of alcohol per week. She reports that she does not use drugs.   ALLERGIES: Penicillins   MEDICATIONS:  Current Outpatient Medications  Medication Sig Dispense Refill   ALPRAZolam (XANAX) 0.5 MG tablet Take 0.5 mg by mouth at bedtime as needed.     amLODipine (NORVASC) 2.5 MG tablet Take 1 tablet by mouth daily.     diclofenac Sodium (VOLTAREN) 1 % GEL Apply 2 g topically 4 (four) times daily.     olmesartan (BENICAR) 40 MG tablet Take 1 tablet by mouth daily.     traMADol (ULTRAM) 50 MG tablet Take 1 tablet by mouth every 8 (eight) hours as needed.     No current facility-administered medications for this visit.     REVIEW OF SYSTEMS: The patient reports that s***/he is doing well overall and a review of symptoms is otherwise negative.      PHYSICAL EXAM:  Wt Readings from Last 3 Encounters:  06/01/24 175 lb (79.4 kg)  08/07/22 183 lb 12.8 oz (83.4 kg)   Temp Readings from Last 3 Encounters:  06/01/24 97.8 F (36.6 C) (Temporal)   BP Readings from Last 3 Encounters:  06/01/24 (!) 153/81  08/07/22 (!) 191/95   Pulse Readings from Last 3  Encounters:  06/01/24 64  08/07/22 61    /10   Physical Exam   Breast Exam:  Well-healed lumpectomy/re-excision scar in the *** quadrant. No erythema, induration, or drainage. No palpable masses or nodularity at the surgical site. No skin changes, nipple inversion, or discharge.   ECOG = ***   LABORATORY DATA:  No results found for: WBC, HGB, HCT, MCV, PLT No results found for: NA, K, CL, CO2 No results found for: ALT, AST, GGT, ALKPHOS, BILITOT    RADIOGRAPHY: US  RT BREAST BX W LOC DEV 1ST LESION IMG BX SPEC US   GUIDE Addendum Date: 08/05/2024 ADDENDUM REPORT: 08/05/2024 13:23 ADDENDUM: PATHOLOGY revealed: Site 1. Breast, right, needle core biopsy, 10 o'clock, 5 cmfn, ribbon- INVASIVE DUCTAL CARCINOMA, SEE NOTE DUCTAL CARCINOMA IN SITU, SOLID, INTERMEDIATE NUCLEAR GRADE, WITHOUT NECROSIS- OVERALL GRADE: 1. LYMPHOVASCULAR INVASION: NOT IDENTIFIED, CANCER LENGTH: 0.4 CM, CALCIFICATIONS: NOT IDENTIFIED. Pathology results are CONCORDANT with imaging findings, per Dr. Craig Farr. PATHOLOGY revealed: Site 2. Breast, right, needle core biopsy, 11 o'clock, 2 cmfn, coil- FIBROCYSTIC CHANGES WITH USUAL DUCTAL HYPERPLASIA. NEGATIVE FOR MALIGNANCY. Pathology results are CONCORDANT with imaging findings, per Dr. Craig Farr. Pathology results and recommendations below were discussed with patient by telephone on 08/05/2024 by Rock Hover RN. Patient reported biopsy site within normal limits with slight tenderness at the site. Post biopsy care instructions were reviewed, questions were answered and my direct phone number was provided to patient. Patient was instructed to call [facility] if any concerns or questions arise related to the biopsy. RECOMMENDATION: Surgical and oncological consultation for site 1 only. The patient was referred to [facility] with appointment on 08/13/24. Pathology results reported by Rock Hover RN on 08/05/2024. Electronically Signed   By: Craig Farr M.D.   On: 08/05/2024 13:23   Result Date: 08/05/2024 CLINICAL DATA:  Right breast masses for biopsy EXAM: ULTRASOUND GUIDED RIGHT BREAST CORE NEEDLE BIOPSY COMPARISON:  Previous exam(s). PROCEDURE: I met with the patient and we discussed the procedure of ultrasound-guided biopsy, including benefits and alternatives. We discussed the high likelihood of a successful procedure. We discussed the risks of the procedure, including infection, bleeding, tissue  injury, clip migration, and inadequate sampling. Informed written consent was given. The usual time-out protocol was performed immediately prior to the procedure. 1: Lesion quadrant: Upper-outer quadrant Using sterile technique and 1% Lidocaine  as local anesthetic, under direct ultrasound visualization, a 12 gauge spring-loaded device was used to perform biopsy of mass at right breast 10 o'clock using a lateral approach. At the conclusion of the procedure ribbon shaped tissue marker clip was deployed into the biopsy cavity. Follow up 2 view mammogram was performed and dictated separately. 2: Lesion quadrant: Upper-outer quadrant Using sterile technique and 1% Lidocaine  as local anesthetic, under direct ultrasound visualization, a 12 gauge spring-loaded device was used to perform biopsy of mass at right breast 11 o'clock using a lateral approach. At the conclusion of the procedure coil shaped tissue marker clip was deployed into the biopsy cavity. Follow up 2 view mammogram was performed and dictated separately. IMPRESSION: Ultrasound guided biopsies of right breast. No apparent complications. Electronically Signed: By: Craig Farr M.D. On: 08/04/2024 10:22   US  RT BREAST BX W LOC DEV EA ADD LESION IMG BX SPEC US  GUIDE Addendum Date: 08/05/2024 ADDENDUM REPORT: 08/05/2024 13:23 ADDENDUM: PATHOLOGY revealed: Site 1. Breast, right, needle core biopsy, 10 o'clock, 5 cmfn, ribbon- INVASIVE DUCTAL CARCINOMA,  SEE NOTE DUCTAL CARCINOMA IN SITU, SOLID, INTERMEDIATE NUCLEAR GRADE, WITHOUT NECROSIS- OVERALL GRADE: 1. LYMPHOVASCULAR INVASION: NOT IDENTIFIED, CANCER LENGTH: 0.4 CM, CALCIFICATIONS: NOT IDENTIFIED. Pathology results are CONCORDANT with imaging findings, per Dr. Craig Farr. PATHOLOGY revealed: Site 2. Breast, right, needle core biopsy, 11 o'clock, 2 cmfn, coil- FIBROCYSTIC CHANGES WITH USUAL DUCTAL HYPERPLASIA. NEGATIVE FOR MALIGNANCY. Pathology results are CONCORDANT with imaging findings, per Dr. Craig Farr. Pathology results and recommendations below were discussed with patient by telephone on 08/05/2024 by Rock Hover RN. Patient reported biopsy site within normal limits with slight tenderness at the site. Post biopsy care instructions were reviewed, questions were answered and my direct phone number was provided to patient. Patient was instructed to call [facility] if any concerns or questions arise related to the biopsy. RECOMMENDATION: Surgical and oncological consultation for site 1 only. The patient was referred to [facility] at [facility] with appointment on 08/13/24. Pathology results reported by Rock Hover RN on 08/05/2024. Electronically Signed   By: Craig Farr M.D.   On: 08/05/2024 13:23   Result Date: 08/05/2024 CLINICAL DATA:  Right breast masses for biopsy EXAM: ULTRASOUND GUIDED RIGHT BREAST CORE NEEDLE BIOPSY COMPARISON:  Previous exam(s). PROCEDURE: I met with the patient and we discussed the procedure of ultrasound-guided biopsy, including benefits and alternatives. We discussed the high likelihood of a successful procedure. We discussed the risks of the procedure, including infection, bleeding, tissue injury, clip migration, and inadequate sampling. Informed written consent was given. The usual time-out protocol was performed immediately prior to the procedure. 1: Lesion quadrant: Upper-outer quadrant Using sterile technique and 1% Lidocaine  as local anesthetic, under direct ultrasound visualization, a 12 gauge spring-loaded device was used to perform biopsy of mass at right breast 10 o'clock using a lateral approach. At the conclusion of the procedure ribbon shaped tissue marker clip was deployed into the biopsy cavity. Follow up 2 view mammogram was performed and dictated separately. 2: Lesion quadrant: Upper-outer quadrant Using sterile technique and 1% Lidocaine  as local anesthetic, under direct  ultrasound visualization, a 12 gauge spring-loaded device was used to perform biopsy of mass at right breast 11 o'clock using a lateral approach. At the conclusion of the procedure coil shaped tissue marker clip was deployed into the biopsy cavity. Follow up 2 view mammogram was performed and dictated separately. IMPRESSION: Ultrasound guided biopsies of right breast. No apparent complications. Electronically Signed: By: Craig Farr M.D. On: 08/04/2024 10:22   MM CLIP PLACEMENT RIGHT Result Date: 08/04/2024 CLINICAL DATA:  Status post ultrasound-guided core biopsies of right breast masses. EXAM: 3D DIAGNOSTIC RIGHT MAMMOGRAM POST ULTRASOUND BIOPSY COMPARISON:  Previous exam(s). ACR Breast Density Category b: There are scattered areas of fibroglandular density. FINDINGS: 1: 3D Mammographic images were obtained following ultrasound guided biopsy of mass at right breast 10 o'clock. The ribbon biopsy marking clip is in adjacent to the mass of concern. 2: 3D Mammographic images were obtained following ultrasound guided biopsy of mass at right breast 11 o'clock. The coil biopsy marking clip is in expected position at the site of biopsy. IMPRESSION: 1: Appropriate positioning of the ribbon shaped biopsy marking clip at the site of biopsyadjacent to the mass of concern right breast 10 o'clock. 2: Appropriate positioning of the coil shaped biopsy marking clip at the site of biopsy in the expected location of concern mass at right breast 11 o'clock. Final Assessment: Post Procedure Mammograms for Marker Placement Electronically Signed  By: Craig Farr M.D.   On: 08/04/2024 10:36   MM 3D DIAGNOSTIC MAMMOGRAM UNILATERAL RIGHT BREAST Result Date: 08/02/2024 CLINICAL DATA:  68 year old female recalled for spiculated mass in the upper outer right breast on recent screening mammogram. EXAM: DIGITAL DIAGNOSTIC UNILATERAL RIGHT MAMMOGRAM WITH TOMOSYNTHESIS AND CAD; ULTRASOUND RIGHT BREAST LIMITED TECHNIQUE: Right digital  diagnostic mammography and breast tomosynthesis was performed. The images were evaluated with computer-aided detection. ; Targeted ultrasound examination of the right breast was performed COMPARISON:  Previous exam(s). ACR Breast Density Category b: There are scattered areas of fibroglandular density. FINDINGS: Right breast mammogram: There is a 5 mm irregular mass with spiculated borders in the upper outer right breast middle depth 5 cm from the nipple. There is a second equal density oval 5 mm mass in the upper outer right breast anterior depth approximately 1 cm from the nipple. An ultrasound will be performed next for further evaluation of both areas. Targeted ultrasound the upper-outer right breast was performed which demonstrates in hypoechoic 0.6 x 0.6 x 0.4 cm mass with ill-defined borders in the right breast at 10 o'clock 5 cm from the nipple. This corresponds to the spiculated mass on mammography. In the right breast at 11 o'clock 2 cm from the nipple there is a slightly irregular 4 x 3 by 5 mm bilobed mass which is part anechoic part hypoechoic. Normal appearing lymph nodes in the right axilla. IMPRESSION: Highly suspicious 6 mm mass in the right breast 10 o'clock middle depth 5 cm from the nipple. Second suspicious part cystic 5 mm mass in the right breast 11 o'clock 2 cm from the nipple. RECOMMENDATION: Ultrasound-guided biopsy of both right breast masses is recommended I have discussed the findings and recommendations with the patient. If applicable, a reminder letter will be sent to the patient regarding the next appointment. BI-RADS CATEGORY  5: Highly suggestive of malignancy. Electronically Signed   By: Rosina Gelineau M.D.   On: 08/02/2024 11:29   US  LIMITED ULTRASOUND INCLUDING AXILLA RIGHT BREAST Result Date: 08/02/2024 CLINICAL DATA:  68 year old female recalled for spiculated mass in the upper outer right breast on recent screening mammogram. EXAM: DIGITAL DIAGNOSTIC UNILATERAL RIGHT  MAMMOGRAM WITH TOMOSYNTHESIS AND CAD; ULTRASOUND RIGHT BREAST LIMITED TECHNIQUE: Right digital diagnostic mammography and breast tomosynthesis was performed. The images were evaluated with computer-aided detection. ; Targeted ultrasound examination of the right breast was performed COMPARISON:  Previous exam(s). ACR Breast Density Category b: There are scattered areas of fibroglandular density. FINDINGS: Right breast mammogram: There is a 5 mm irregular mass with spiculated borders in the upper outer right breast middle depth 5 cm from the nipple. There is a second equal density oval 5 mm mass in the upper outer right breast anterior depth approximately 1 cm from the nipple. An ultrasound will be performed next for further evaluation of both areas. Targeted ultrasound the upper-outer right breast was performed which demonstrates in hypoechoic 0.6 x 0.6 x 0.4 cm mass with ill-defined borders in the right breast at 10 o'clock 5 cm from the nipple. This corresponds to the spiculated mass on mammography. In the right breast at 11 o'clock 2 cm from the nipple there is a slightly irregular 4 x 3 by 5 mm bilobed mass which is part anechoic part hypoechoic. Normal appearing lymph nodes in the right axilla. IMPRESSION: Highly suspicious 6 mm mass in the right breast 10 o'clock middle depth 5 cm from the nipple. Second suspicious part cystic 5 mm mass in the right breast  11 o'clock 2 cm from the nipple. RECOMMENDATION: Ultrasound-guided biopsy of both right breast masses is recommended I have discussed the findings and recommendations with the patient. If applicable, a reminder letter will be sent to the patient regarding the next appointment. BI-RADS CATEGORY  5: Highly suggestive of malignancy. Electronically Signed   By: Rosina Gelineau M.D.   On: 08/02/2024 11:29     PATHOLOGY:  R Breast Biopsy x2 08/04/24:  Accession #: [id number] Patient Name: LANA, FLAIM Visit # : [id number]  MRN:  [id number] Physician: Melia Rast DOB/Age [birth year] (Age: 34) Gender: F Collected Date: 08/04/2024 Received Date: 08/04/2024  FINAL DIAGNOSIS  1. Breast, right, needle core biopsy, 10 o'clock, 5cmfn, ribbon : INVASIVE DUCTAL CARCINOMA, SEE NOTE DUCTAL CARCINOMA IN SITU, SOLID, INTERMEDIATE NUCLEAR GRADE, WITHOUT NECROSIS TUBULE FORMATION: SCORE 2 NUCLEAR PLEOMORPHISM: SCORE 2 MITOTIC COUNT: SCORE 1 TOTAL SCORE: 5 OVERALL GRADE: 1 LYMPHOVASCULAR INVASION: NOT IDENTIFIED CANCER LENGTH: 0.4 CM CALCIFICATIONS: NOT IDENTIFIED OTHER FINDINGS: NONE  2. Breast, right, needle core biopsy, 11 o'clock, 2cmfn, coil : FIBROCYSTIC CHANGES WITH USUAL DUCTAL HYPERPLASIA. NEGATIVE FOR MALIGNANCY.     IMMUNOHISTOCHEMICAL AND MORPHOMETRIC ANALYSIS PERFORMED MANUALLY The tumor cells are NEGATIVE for Her2 (0). Estrogen Receptor:  100%, POSITIVE, STRONG STAINING INTENSITY Progesterone Receptor:  80%, POSITIVE, STRONG STAINING INTENSITY Proliferation Marker Ki67:  1%    IMPRESSION/PLAN:   Patient with an anatomic and prognostic Stage IA [id number] Grade 1 IDC of the R breast, ER/PR+, Her2-, Ki67 of 1% who is seen pre-operatively for consideration of adjuvant radiation therapy. We discussed the role of breast irradiation in reducing the risk of local recurrence and improving long-term disease control. We reviewed that while radiation can reduce local recurrence this has not been shown to translate to a survival benefit in similar patients.  Based on current findings, omission of radiation is an option. If she wishes to reduce her risk of local recurrence, we would offer adjuvant WBRT.  We reviewed the logistics of treatment in detail, including the need for a CT simulation for treatment planning, followed by several days for contouring, dosimetry, and quality assurance prior to starting therapy.   The planned course of radiation therapy will consist of daily treatments, Monday through Friday, over  approximately 1-3 weeks. The decision between a moderately hypofractionated regimen and an ultrahypofractionated regimen will be made based on patient preference after discussion of the risks, benefits, and available data. It was explained that longer-term outcome data are available for moderate hypofractionation; however, based on current evidence, both approaches appear comparable in terms of disease control and toxicity profiles. Moderately hypofractionated RT remains the standard of care as endorsed by the American Society for Radiation Oncology.  Reviewed that each treatment session, should she choose to pursue radiation, typically lasts only a few minutes, though positioning and setup may take additional time. The patient should plan to be in the department for approximately 45 minutes per visit. She will be evaluated by me at least once weekly during treatment to monitor for side effects, assess tolerance, and ensure it is safe to continue therapy.  We discussed acute and subacute side effects which are generally gradual in onset and typically include fatigue, skin erythema, tanning or hyperpigmentation, breast edema or firmness and mild tenderness.  Less common but possible side effects include desquamation of the skin, decreased range of motion of the shoulder, and transient changes in breast size and texture.  Long-term risk such as cosmetic changes, rare risk of rib fracture and cardiopulmonary effects  were also reviewed.  Reassured patient that most acute side effects improve over weeks to months after completing therapy.    Patient verbalized understanding of these risks and benefits and she is in agreement with the plan to reassess and review treatment options after surgery. I will see her in follow-up then. Final treatment planning will depend on her postoperative pathology report, but we anticipate adjuvant whole breast radiation and omission will be options for her. Do not anticipate the need  to radiate her axilla or any nodal regions.   --  Total time spent today in preparation for this visit was 60 minutes. This included patient care, imaging and path review, documentation, multidisciplinary discussion and coordination of care and follow up.    Estefana HERO. Maritza, M.D.

## 2024-08-13 ENCOUNTER — Ambulatory Visit: Admitting: Physical Therapy

## 2024-08-13 ENCOUNTER — Inpatient Hospital Stay

## 2024-08-13 ENCOUNTER — Ambulatory Visit
Admission: RE | Admit: 2024-08-13 | Discharge: 2024-08-13 | Disposition: A | Discharge status: Home / Self Care | Source: Ambulatory Visit | Attending: Radiation Oncology | Admitting: Radiation Oncology

## 2024-08-13 ENCOUNTER — Ambulatory Visit: Payer: Self-pay | Admitting: Surgery

## 2024-08-13 ENCOUNTER — Inpatient Hospital Stay: Attending: Hematology and Oncology | Admitting: Hematology and Oncology

## 2024-08-13 ENCOUNTER — Encounter: Payer: Self-pay | Admitting: *Deleted

## 2024-08-13 ENCOUNTER — Other Ambulatory Visit: Payer: Self-pay

## 2024-08-13 ENCOUNTER — Inpatient Hospital Stay: Attending: Hematology and Oncology

## 2024-08-13 VITALS — BP 140/73 | HR 89 | Temp 99.2°F | Resp 16 | Wt 170.0 lb

## 2024-08-13 DIAGNOSIS — C50411 Malignant neoplasm of upper-outer quadrant of right female breast: Secondary | ICD-10-CM | POA: Insufficient documentation

## 2024-08-13 DIAGNOSIS — Z1721 Progesterone receptor positive status: Secondary | ICD-10-CM | POA: Diagnosis not present

## 2024-08-13 DIAGNOSIS — C50911 Malignant neoplasm of unspecified site of right female breast: Secondary | ICD-10-CM

## 2024-08-13 DIAGNOSIS — Z803 Family history of malignant neoplasm of breast: Secondary | ICD-10-CM | POA: Diagnosis not present

## 2024-08-13 DIAGNOSIS — Z801 Family history of malignant neoplasm of trachea, bronchus and lung: Secondary | ICD-10-CM

## 2024-08-13 DIAGNOSIS — Z800 Family history of malignant neoplasm of digestive organs: Secondary | ICD-10-CM

## 2024-08-13 DIAGNOSIS — Z170 Estrogen receptor positive status [ER+]: Secondary | ICD-10-CM | POA: Insufficient documentation

## 2024-08-13 LAB — CBC WITH DIFFERENTIAL (CANCER CENTER ONLY)
Abs Immature Granulocytes: 0.01 K/uL (ref 0.00–0.07)
Basophils Absolute: 0 K/uL (ref 0.0–0.1)
Basophils Relative: 0 %
Eosinophils Absolute: 0.1 K/uL (ref 0.0–0.5)
Eosinophils Relative: 1 %
HCT: 41.2 % (ref 36.0–46.0)
Hemoglobin: 13.8 g/dL (ref 12.0–15.0)
Immature Granulocytes: 0 %
Lymphocytes Relative: 21 %
Lymphs Abs: 1.5 K/uL (ref 0.7–4.0)
MCH: 28 pg (ref 26.0–34.0)
MCHC: 33.5 g/dL (ref 30.0–36.0)
MCV: 83.7 fL (ref 80.0–100.0)
Monocytes Absolute: 0.6 K/uL (ref 0.1–1.0)
Monocytes Relative: 8 %
Neutro Abs: 5 K/uL (ref 1.7–7.7)
Neutrophils Relative %: 70 %
Platelet Count: 329 K/uL (ref 150–400)
RBC: 4.92 MIL/uL (ref 3.87–5.11)
RDW: 11.7 % (ref 11.5–15.5)
WBC Count: 7.2 K/uL (ref 4.0–10.5)
nRBC: 0 % (ref 0.0–0.2)

## 2024-08-13 LAB — CMP (CANCER CENTER ONLY)
ALT: 31 U/L (ref 0–44)
AST: 30 U/L (ref 15–41)
Albumin: 4.9 g/dL (ref 3.5–5.0)
Alkaline Phosphatase: 56 U/L (ref 38–126)
Anion gap: 12 (ref 5–15)
BUN: 14 mg/dL (ref 8–23)
CO2: 29 mmol/L (ref 22–32)
Calcium: 10.2 mg/dL (ref 8.9–10.3)
Chloride: 93 mmol/L — ABNORMAL LOW (ref 98–111)
Creatinine: 0.76 mg/dL (ref 0.44–1.00)
GFR, Estimated: >60 mL/min (ref >60)
Glucose, Bld: 87 mg/dL (ref 70–99)
Potassium: 4.1 mmol/L (ref 3.5–5.1)
Sodium: 135 mmol/L (ref 135–145)
Total Bilirubin: 0.9 mg/dL (ref 0.0–1.2)
Total Protein: 8.3 g/dL — ABNORMAL HIGH (ref 6.5–8.1)

## 2024-08-13 NOTE — Progress Notes (Signed)
 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  - Proceed with lumpectomy. - We will consider oncotype DX if tumor is greater than 5 mm. - Prescribe anastrozole or letrozole post-surgery for five years. We have discussed options for antiestrogen therapy. With regards to aromatase inhibitors, we discussed mechanism of action, adverse effects including but not limited to post menopausal symptoms, arthralgias, myalgias, increased risk of cardiovascular events and bone loss. We have also discussed about tamoxifen today. - She will return to clinic after surgery to review additional recommendations.  HISTORY OF PRESENTING ILLNESS:  Katelyn Rich 68 y.o. female is here because of new diagnosis of breast cancer  Discussed the use of AI scribe software for clinical note transcription with the patient, who gave verbal consent to proceed.  History of Present Illness Katelyn Rich is a 68 year old female who presents for oncology consultation for newly diagnosed stage 1 invasive ductal carcinoma of the right breast. She is accompanied by her sister Donny, her daughter Creighton, and her wife. She was referred by her gynecologist after a routine mammogram revealed suspicious areas in the right breast.  A routine  mammogram revealed two small areas in the right breast at the ten o'clock and eleven o'clock positions. The ten o'clock area was identified as invasive ductal carcinoma, while the eleven o'clock area was benign.  No palpable masses or symptoms were noted prior to the mammogram. Her gynecologist did not detect any abnormalities during the physical exam but recommended the mammogram as part of her annual screening.  She has not used any hormone replacement therapy in the past. Her family history is significant for her mother having had breast cancer and taking tamoxifen approximately thirty years ago.  She is currently not on any medications for breast cancer and has not started any anti-estrogen therapy yet. She is scheduled to go on a cruise from December 17th to 22nd.  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  All other systems were reviewed with the patient and are negative.  MEDICAL HISTORY:  Past Medical History:  Diagnosis Date   Hypertension     SURGICAL HISTORY: Past Surgical History:  Procedure Laterality Date   ANTERIOR CRUCIATE LIGAMENT (ACL) REVISION     BREAST BIOPSY Right 08/04/2024   US  RT BREAST BX W LOC DEV 1ST LESION IMG BX SPEC US  GUIDE 08/04/2024 [facility]   BREAST BIOPSY Right 08/04/2024   US  RT BREAST BX W LOC DEV EA ADD LESION IMG BX SPEC US  GUIDE 08/04/2024 [facility]AMMO[facility]   CATARACT EXTRACTION, BILATERAL     CHOLECYSTECTOMY      SOCIAL HISTORY: Social History   Socioeconomic History   Marital status: Married    Spouse name: Not on file   Number of children: Not on file   Years of education: Not on file   Highest education level: Not on  file  Occupational History   Not on file  Tobacco Use   Smoking status: Never   Smokeless tobacco: Never  Vaping Use   Vaping status: Never Used  Substance and Sexual Activity   Alcohol use: Yes    Alcohol/week: 4.0 standard drinks of alcohol    Types:  4 Cans of beer per week   Drug use: Never   Sexual activity: Not on file  Other Topics Concern   Not on file  Social History Narrative   Are you right handed or left handed? Right handed   Are you currently employed ? NO    What is your current occupation?    Do you live at home alone? NO   Who lives with you?  Husband    What type of home do you live in: 1 story or 2 story? 1       Social Drivers of Corporate Investment Banker Strain: Not on file  Food Insecurity: No Food Insecurity (08/08/2024)   Hunger Vital Sign    Worried About Running Out of Food in the Last Year: Never true    Ran Out of Food in the Last Year: Never true  Transportation Needs: No Transportation Needs (08/08/2024)   PRAPARE - Administrator, Civil Service (Medical): No    Lack of Transportation (Non-Medical): No  Physical Activity: Not on file  Stress: Not on file  Social Connections: Not on file  Intimate Partner Violence: Not on file    FAMILY HISTORY: Family History  Problem Relation Age of Onset   Dementia Mother    Migraines Daughter     ALLERGIES:  is allergic to penicillins.  MEDICATIONS:  Current Outpatient Medications  Medication Sig Dispense Refill   olmesartan (BENICAR) 40 MG tablet Take 1 tablet by mouth daily.     ALPRAZolam (XANAX) 0.5 MG tablet Take 0.5 mg by mouth at bedtime as needed. (Patient not taking: Reported on 08/13/2024)     amLODipine (NORVASC) 2.5 MG tablet Take 1 tablet by mouth daily. (Patient not taking: Reported on 08/13/2024)     diclofenac Sodium (VOLTAREN) 1 % GEL Apply 2 g topically 4 (four) times daily. (Patient not taking: Reported on 08/13/2024)     traMADol (ULTRAM) 50 MG tablet Take 1 tablet by mouth every 8 (eight) hours as needed. (Patient not taking: Reported on 08/13/2024)     No current facility-administered medications for this visit.     PHYSICAL EXAMINATION: ECOG PERFORMANCE STATUS: 0 - Asymptomatic  Vitals:   08/13/24 0858  BP:  (!) 140/73  Pulse: 89  Resp: 16  Temp: 99.2 F (37.3 C)  SpO2: 99%   Filed Weights   08/13/24 0858  Weight: 170 lb (77.1 kg)    GENERAL:alert, no distress and comfortable SKIN: skin color, texture, turgor are normal, no rashes or significant lesions EYES: normal, conjunctiva are pink and non-injected, sclera clear OROPHARYNX:no exudate, no erythema and lips, buccal mucosa, and tongue normal  NECK: supple, thyroid normal size, non-tender, without nodularity LYMPH:  no palpable lymphadenopathy in the cervical, axillary or inguinal LUNGS: clear to auscultation and percussion with normal breathing effort HEART: regular rate & rhythm and no murmurs and no lower extremity edema ABDOMEN:abdomen soft, non-tender and normal bowel sounds Musculoskeletal:no cyanosis of digits and no clubbing  PSYCH: alert & oriented x 3 with fluent speech NEURO: no focal motor/sensory deficits  LABORATORY DATA:  I have reviewed the data as listed No results found for: WBC,  HGB, HCT, MCV, PLT   Chemistry   No results found for: NA, K, CL, CO2, BUN, CREATININE, GLU No results found for: CALCIUM, ALKPHOS, AST, ALT, BILITOT     RADIOGRAPHIC STUDIES: I have personally reviewed the radiological images as listed and agreed with the findings in the report. US  RT BREAST BX W LOC DEV 1ST LESION IMG BX SPEC US  GUIDE Addendum Date: 08/05/2024 ADDENDUM REPORT: 08/05/2024 13:23 ADDENDUM: PATHOLOGY revealed: Site 1. Breast, right, needle core biopsy, 10 o'clock, 5 cmfn, ribbon- INVASIVE DUCTAL CARCINOMA, SEE NOTE DUCTAL CARCINOMA IN SITU, SOLID, INTERMEDIATE NUCLEAR GRADE, WITHOUT NECROSIS- OVERALL GRADE: 1. LYMPHOVASCULAR INVASION: NOT IDENTIFIED, CANCER LENGTH: 0.4 CM, CALCIFICATIONS: NOT IDENTIFIED. Pathology results are CONCORDANT with imaging findings, per Dr. Craig Farr. PATHOLOGY revealed: Site 2. Breast, right, needle core biopsy, 11 o'clock, 2 cmfn, coil- FIBROCYSTIC CHANGES  WITH USUAL DUCTAL HYPERPLASIA. NEGATIVE FOR MALIGNANCY. Pathology results are CONCORDANT with imaging findings, per Dr. Craig Farr. Pathology results and recommendations below were discussed with patient by telephone on 08/05/2024 by Rock Hover RN. Patient reported biopsy site within normal limits with slight tenderness at the site. Post biopsy care instructions were reviewed, questions were answered and my direct phone number was provided to patient. Patient was instructed to call [facility] if any concerns or questions arise related to the biopsy. RECOMMENDATION: Surgical and oncological consultation for site 1 only. The patient was referred to [facility] with appointment on 08/13/24. Pathology results reported by Rock Hover RN on 08/05/2024. Electronically Signed   By: Craig Farr M.D.   On: 08/05/2024 13:23   Result Date: 08/05/2024 CLINICAL DATA:  Right breast masses for biopsy EXAM: ULTRASOUND GUIDED RIGHT BREAST CORE NEEDLE BIOPSY COMPARISON:  Previous exam(s). PROCEDURE: I met with the patient and we discussed the procedure of ultrasound-guided biopsy, including benefits and alternatives. We discussed the high likelihood of a successful procedure. We discussed the risks of the procedure, including infection, bleeding, tissue injury, clip migration, and inadequate sampling. Informed written consent was given. The usual time-out protocol was performed immediately prior to the procedure. 1: Lesion quadrant: Upper-outer quadrant Using sterile technique and 1% Lidocaine  as local anesthetic, under direct ultrasound visualization, a 12 gauge spring-loaded device was used to perform biopsy of mass at right breast 10 o'clock using a lateral approach. At the conclusion of the procedure ribbon shaped tissue marker clip was deployed into the biopsy cavity. Follow up 2 view mammogram was performed and dictated  separately. 2: Lesion quadrant: Upper-outer quadrant Using sterile technique and 1% Lidocaine  as local anesthetic, under direct ultrasound visualization, a 12 gauge spring-loaded device was used to perform biopsy of mass at right breast 11 o'clock using a lateral approach. At the conclusion of the procedure coil shaped tissue marker clip was deployed into the biopsy cavity. Follow up 2 view mammogram was performed and dictated separately. IMPRESSION: Ultrasound guided biopsies of right breast. No apparent complications. Electronically Signed: By: Craig Farr M.D. On: 08/04/2024 10:22   US  RT BREAST BX W LOC DEV EA ADD LESION IMG BX SPEC US  GUIDE Addendum Date: 08/05/2024 ADDENDUM REPORT: 08/05/2024 13:23 ADDENDUM: PATHOLOGY revealed: Site 1. Breast, right, needle core biopsy, 10 o'clock, 5 cmfn, ribbon- INVASIVE DUCTAL CARCINOMA, SEE NOTE DUCTAL CARCINOMA IN SITU, SOLID, INTERMEDIATE NUCLEAR GRADE, WITHOUT NECROSIS- OVERALL GRADE: 1. LYMPHOVASCULAR INVASION: NOT IDENTIFIED, CANCER LENGTH: 0.4 CM, CALCIFICATIONS: NOT IDENTIFIED. Pathology results are CONCORDANT with imaging findings, per Dr. Craig  Lin. PATHOLOGY revealed: Site 2. Breast, right, needle core biopsy, 11 o'clock, 2 cmfn, coil- FIBROCYSTIC CHANGES WITH USUAL DUCTAL HYPERPLASIA. NEGATIVE FOR MALIGNANCY. Pathology results are CONCORDANT with imaging findings, per Dr. Craig Farr. Pathology results and recommendations below were discussed with patient by telephone on 08/05/2024 by Rock Hover RN. Patient reported biopsy site within normal limits with slight tenderness at the site. Post biopsy care instructions were reviewed, questions were answered and my direct phone number was provided to patient. Patient was instructed to call [facility] if any concerns or questions arise related to the biopsy. RECOMMENDATION: Surgical and oncological consultation for site 1 only. The patient was referred to [facility] at [facility] with appointment on 08/13/24. Pathology results reported by Rock Hover RN on 08/05/2024. Electronically Signed   By: Craig Farr M.D.   On: 08/05/2024 13:23   Result Date: 08/05/2024 CLINICAL DATA:  Right breast masses for biopsy EXAM: ULTRASOUND GUIDED RIGHT BREAST CORE NEEDLE BIOPSY COMPARISON:  Previous exam(s). PROCEDURE: I met with the patient and we discussed the procedure of ultrasound-guided biopsy, including benefits and alternatives. We discussed the high likelihood of a successful procedure. We discussed the risks of the procedure, including infection, bleeding, tissue injury, clip migration, and inadequate sampling. Informed written consent was given. The usual time-out protocol was performed immediately prior to the procedure. 1: Lesion quadrant: Upper-outer quadrant Using sterile technique and 1% Lidocaine  as local anesthetic, under direct ultrasound visualization, a 12 gauge spring-loaded device was used to perform biopsy of mass at right breast 10 o'clock using a lateral approach. At the conclusion of the procedure ribbon shaped tissue marker clip was deployed into the biopsy cavity. Follow up 2 view mammogram was performed and dictated separately. 2: Lesion quadrant: Upper-outer quadrant Using sterile technique and 1% Lidocaine  as local anesthetic, under direct ultrasound visualization, a 12 gauge spring-loaded device was used to perform biopsy of mass at right breast 11 o'clock using a lateral approach. At the conclusion of the procedure coil shaped tissue marker clip was deployed into the biopsy cavity. Follow up 2 view mammogram was performed and dictated separately. IMPRESSION: Ultrasound guided biopsies of right breast. No apparent complications. Electronically Signed: By: Craig Farr M.D. On: 08/04/2024 10:22   MM CLIP PLACEMENT RIGHT Result Date: 08/04/2024 CLINICAL DATA:  Status post ultrasound-guided core biopsies of  right breast masses. EXAM: 3D DIAGNOSTIC RIGHT MAMMOGRAM POST ULTRASOUND BIOPSY COMPARISON:  Previous exam(s). ACR Breast Density Category b: There are scattered areas of fibroglandular density. FINDINGS: 1: 3D Mammographic images were obtained following ultrasound guided biopsy of mass at right breast 10 o'clock. The ribbon biopsy marking clip is in adjacent to the mass of concern. 2: 3D Mammographic images were obtained following ultrasound guided biopsy of mass at right breast 11 o'clock. The coil biopsy marking clip is in expected position at the site of biopsy. IMPRESSION: 1: Appropriate positioning of the ribbon shaped biopsy marking clip at the site of biopsyadjacent to the mass of concern right breast 10 o'clock. 2: Appropriate positioning of the coil shaped biopsy marking clip at the site of biopsy in the expected location of concern mass at right breast 11 o'clock. Final Assessment: Post Procedure Mammograms for Marker Placement Electronically Signed   By: Craig Farr M.D.   On: 08/04/2024 10:36   MM 3D DIAGNOSTIC MAMMOGRAM UNILATERAL RIGHT BREAST Result Date: 08/02/2024 CLINICAL DATA:  68 year old female recalled for spiculated mass in the upper  outer right breast on recent screening mammogram. EXAM: DIGITAL DIAGNOSTIC UNILATERAL RIGHT MAMMOGRAM WITH TOMOSYNTHESIS AND CAD; ULTRASOUND RIGHT BREAST LIMITED TECHNIQUE: Right digital diagnostic mammography and breast tomosynthesis was performed. The images were evaluated with computer-aided detection. ; Targeted ultrasound examination of the right breast was performed COMPARISON:  Previous exam(s). ACR Breast Density Category b: There are scattered areas of fibroglandular density. FINDINGS: Right breast mammogram: There is a 5 mm irregular mass with spiculated borders in the upper outer right breast middle depth 5 cm from the nipple. There is a second equal density oval 5 mm mass in the upper outer right breast anterior depth approximately 1 cm from the  nipple. An ultrasound will be performed next for further evaluation of both areas. Targeted ultrasound the upper-outer right breast was performed which demonstrates in hypoechoic 0.6 x 0.6 x 0.4 cm mass with ill-defined borders in the right breast at 10 o'clock 5 cm from the nipple. This corresponds to the spiculated mass on mammography. In the right breast at 11 o'clock 2 cm from the nipple there is a slightly irregular 4 x 3 by 5 mm bilobed mass which is part anechoic part hypoechoic. Normal appearing lymph nodes in the right axilla. IMPRESSION: Highly suspicious 6 mm mass in the right breast 10 o'clock middle depth 5 cm from the nipple. Second suspicious part cystic 5 mm mass in the right breast 11 o'clock 2 cm from the nipple. RECOMMENDATION: Ultrasound-guided biopsy of both right breast masses is recommended I have discussed the findings and recommendations with the patient. If applicable, a reminder letter will be sent to the patient regarding the next appointment. BI-RADS CATEGORY  5: Highly suggestive of malignancy. Electronically Signed   By: Rosina Gelineau M.D.   On: 08/02/2024 11:29   US  LIMITED ULTRASOUND INCLUDING AXILLA RIGHT BREAST Result Date: 08/02/2024 CLINICAL DATA:  68 year old female recalled for spiculated mass in the upper outer right breast on recent screening mammogram. EXAM: DIGITAL DIAGNOSTIC UNILATERAL RIGHT MAMMOGRAM WITH TOMOSYNTHESIS AND CAD; ULTRASOUND RIGHT BREAST LIMITED TECHNIQUE: Right digital diagnostic mammography and breast tomosynthesis was performed. The images were evaluated with computer-aided detection. ; Targeted ultrasound examination of the right breast was performed COMPARISON:  Previous exam(s). ACR Breast Density Category b: There are scattered areas of fibroglandular density. FINDINGS: Right breast mammogram: There is a 5 mm irregular mass with spiculated borders in the upper outer right breast middle depth 5 cm from the nipple. There is a second equal density  oval 5 mm mass in the upper outer right breast anterior depth approximately 1 cm from the nipple. An ultrasound will be performed next for further evaluation of both areas. Targeted ultrasound the upper-outer right breast was performed which demonstrates in hypoechoic 0.6 x 0.6 x 0.4 cm mass with ill-defined borders in the right breast at 10 o'clock 5 cm from the nipple. This corresponds to the spiculated mass on mammography. In the right breast at 11 o'clock 2 cm from the nipple there is a slightly irregular 4 x 3 by 5 mm bilobed mass which is part anechoic part hypoechoic. Normal appearing lymph nodes in the right axilla. IMPRESSION: Highly suspicious 6 mm mass in the right breast 10 o'clock middle depth 5 cm from the nipple. Second suspicious part cystic 5 mm mass in the right breast 11 o'clock 2 cm from the nipple. RECOMMENDATION: Ultrasound-guided biopsy of both right breast masses is recommended I have discussed the findings and recommendations with the patient. If applicable, a reminder letter will be  sent to the patient regarding the next appointment. BI-RADS CATEGORY  5: Highly suggestive of malignancy. Electronically Signed   By: Rosina Gelineau M.D.   On: 08/02/2024 11:29    All questions were answered. The patient knows to call the clinic with any problems, questions or concerns. I spent 45 minutes in the care of this patient including H and P, review of records, counseling and coordination of care.     Amber Stalls, MD 08/13/2024 9:44 AM

## 2024-08-13 NOTE — Research (Signed)
 [employer] 2021-05 - Specimen Collection Study to Evaluate Biomarkers in Subjects with Cancer    Patient Katelyn Rich was identified by Dr. Loretha as a potential candidate for the above listed study.  This Clinical Research Coordinator met with [name], [id number], on 08/13/24 in a manner and location that ensures patient privacy to discuss participation in the above listed research study.  Patient is Accompanied by her sister, daughter and husband.  A copy of the informed consent document with embedded HIPAA language was provided to the patient.  Patient reads, speaks, and understands English.    Patient was provided with the business card of this Coordinator and encouraged to contact the research team with any questions.  Approximately 20 minutes were spent with the patient reviewing the informed consent documents.  Patient was provided the option of taking informed consent documents home to review and was encouraged to review at their convenience with their support network, including other care providers. Patient took the consent documents home to review.   [name] was very pleasant to speak with. Pt agreed that the research team may follow up with a phone call any time next week. Pt. was thanked for their time and knows that they can contact us  at any time with questions or concerns.   Abelardo Jock Clinical Research Coordinator [phone] 08/13/2024 12:38 PM

## 2024-08-18 ENCOUNTER — Inpatient Hospital Stay: Attending: Hematology and Oncology | Admitting: Licensed Clinical Social Worker

## 2024-08-18 DIAGNOSIS — Z803 Family history of malignant neoplasm of breast: Secondary | ICD-10-CM | POA: Insufficient documentation

## 2024-08-18 DIAGNOSIS — Z801 Family history of malignant neoplasm of trachea, bronchus and lung: Secondary | ICD-10-CM | POA: Insufficient documentation

## 2024-08-18 DIAGNOSIS — Z800 Family history of malignant neoplasm of digestive organs: Secondary | ICD-10-CM | POA: Insufficient documentation

## 2024-08-18 NOTE — Progress Notes (Signed)
 [facility] Clinical Social Work  Initial Assessment   Nakeisha Greenhouse is a 68 y.o. year old female contacted by phone. Clinical Social Work was referred by [facility] for assessment of psychosocial needs.   SDOH (Social Determinants of Health) assessments performed: Yes   SDOH Screenings   Food Insecurity: No Food Insecurity (08/08/2024)  Housing: Unknown (08/13/2024)   Received from [facility]  Transportation Needs: No Transportation Needs (08/08/2024)  Utilities: Not At Risk (08/08/2024)  Tobacco Use: Medium Risk (08/13/2024)   Received from [facility]    PHQ 2/9:     No data to display           Distress Screen completed: No     No data to display            Family/Social Information:  Housing Arrangement: patient lives with spouse. Moved to [place] from [place] about 17 years ago Family members/support persons in your life? 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  Income source: Actor concerns: No Type of concern: None Food access concerns: no Religious or spiritual practice: Not known Services Currently in place:  [place]  Coping/ Adjustment to diagnosis: Patient understands treatment plan and what happens next? Yes and she is feeling much more positive after learning about diagnosis and plan last week during [facility] Patient reported stressors: Adjusting to my illness Current coping skills/ strengths: Ability for insight , Capable of independent living , Communication skills , Motivation for treatment/growth , and Supportive family/friends     SUMMARY: Current SDOH Barriers:  No major barriers identified today  Clinical Social Work Clinical Goal(s):  No clinical social work goals at this time  Interventions: Discussed common feeling and emotions when being diagnosed with cancer, and the importance of support during  treatment Informed patient of the support team roles and support services at [facility] Provided CSW contact information and encouraged patient to call with any questions or concerns   Follow Up Plan: Patient will contact CSW with any support or resource needs Patient verbalizes understanding of plan: Yes    Amarilys Lyles E Hau Sanor, LCSW Clinical Social Worker [facility]

## 2024-08-18 NOTE — Progress Notes (Signed)
 REFERRING PROVIDER: Vanderbilt Ned, MD [address] [place],  [place] [address]  PRIMARY PROVIDER:  Darra Hamilton, PA-C  PRIMARY REASON FOR VISIT:  1. Malignant neoplasm of upper-outer quadrant of right breast in female, estrogen receptor positive (HCC)   2. Family history of lung cancer   3. Family history of cancer of mouth   4. Family history of breast cancer    HISTORY OF PRESENT ILLNESS:   Katelyn Rich, a 68 y.o. female, was seen on 08/13/2024 for a [facility] cancer genetics consultation in the [facility] at the request of Dr. Vanderbilt due to a personal history of breast cancer.  Katelyn Rich presents to clinic today to discuss the possibility of a hereditary predisposition to cancer, genetic testing, and to further clarify her future cancer risks, as well as potential cancer risks for family members.   CANCER HISTORY:  In 2025, at the age of 39, Katelyn Rich was diagnosed with invasive ductal carcinoma of the right breast. The treatment plan is lumpectomy. Her care team will consider running an Oncotype DX is the tumor is greater than 5 mm. Anastrozole or letrozole post-surgery for five years will be prescribed. Please see Dr. Walter note for more detail regarding the treatment plan.   RELEVANT MEDICAL HISTORY AND RISK FACTORS:  Menarche was at age 66.  First live birth at age 19.  OCP use for approximately 0 years.  Ovaries intact: yes.  Hysterectomy: no.  Menopausal status: postmenopausal.  Mammogram within the last year: yes. Breast density: B Up to date with pelvic exams: yes. Colonoscopy: no; not examined. She reports doing Cologuard. Any excessive radiation exposure in the past: no Other cancer screenings: no.  Exposures: no.   Past Medical History:  Diagnosis Date   Family history of breast cancer    Family history of cancer of mouth    Family history of lung cancer    Hypertension     Past Surgical History:  Procedure  Laterality Date   ANTERIOR CRUCIATE LIGAMENT (ACL) REVISION     BREAST BIOPSY Right 08/04/2024   US  RT BREAST BX W LOC DEV 1ST LESION IMG BX SPEC US  GUIDE 08/04/2024 [facility]   BREAST BIOPSY Right 08/04/2024   US  RT BREAST BX W LOC DEV EA ADD LESION IMG BX SPEC US  GUIDE 08/04/2024 [facility]   CATARACT EXTRACTION, BILATERAL     CHOLECYSTECTOMY      Social History   Socioeconomic History   Marital status: Married    Spouse name: Not on file   Number of children: Not on file   Years of education: Not on file   Highest education level: Not on file  Occupational History   Not on file  Tobacco Use   Smoking status: Former    Types: Cigarettes   Smokeless tobacco: Never  Vaping Use   Vaping status: Never Used  Substance and Sexual Activity   Alcohol use: Yes    Alcohol/week: 4.0 standard drinks of alcohol    Types: 4 Cans of beer per week   Drug use: Never   Sexual activity: Not on file  Other Topics Concern   Not on file  Social History Narrative   Are you right handed or left handed? Right handed   Are you currently employed ? NO    What is your current occupation?    Do you live at home alone? NO   Who lives with you?  Husband    What  type of home do you live in: 1 story or 2 story? 1       Social Drivers of Corporate Investment Banker Strain: Not on file  Food Insecurity: No Food Insecurity (08/08/2024)   Hunger Vital Sign    Worried About Running Out of Food in the Last Year: Never true    Ran Out of Food in the Last Year: Never true  Transportation Needs: No Transportation Needs (08/08/2024)   PRAPARE - Administrator, Civil Service (Medical): No    Lack of Transportation (Non-Medical): No  Physical Activity: Not on file  Stress: Not on file  Social Connections: Not on file     FAMILY HISTORY:  We obtained a detailed, 4-generation family history.  Significant diagnoses are listed below: Family History  Problem Relation Age of  Onset   Dementia Mother    Breast cancer Mother 73       lumpectomy   Breast cancer Maternal Aunt 21 - 49   Lung cancer Maternal Aunt        used tobacco   Migraines Daughter    Breast cancer Paternal Cousin 31   Cancer Paternal Cousin        mouth cancer   Cancer Paternal Cousin        GI tract cancer    Katelyn Rich reports the following family history: Her mother had breast cancer at 62. She had a lumpectomy. Her maternal aunt had breast cancer in her 49s. Her other maternal aunt had lung cancer and used tobacco. Her paternal cousin had breast cancer age 96. Her other paternal cousin had a GI cancer. Her other paternal cousin had mouth cancer.  Katelyn Rich is unaware of previous family history of genetic testing for hereditary cancer risks. There is no reported Ashkenazi Jewish ancestry. There is no known consanguinity.  GENETIC COUNSELING ASSESSMENT:  Katelyn Rich is a 68 y.o. female with a personal and family history of breast cancer which is somewhat suggestive of a hereditary cancer syndrome and predisposition to cancer given this history. We, therefore, discussed and recommended the following at today's visit.   DISCUSSION: We discussed that, in general, most cancer is not inherited in families, but instead is sporadic or familial. Sporadic cancers occur by chance and typically happen at older ages (>50 years) as this type of cancer is caused by genetic changes acquired during an individual's lifetime. Some families have more cancers than would be expected by chance; however, the ages or types of cancer are not consistent with a known genetic mutation or known genetic mutations have been ruled out. This type of familial cancer is thought to be due to a combination of multiple genetic, environmental, hormonal, and lifestyle factors. While this combination of factors likely increases the risk of cancer, the exact source of this risk is not currently identifiable or  testable.  We discussed that 5 - 10% of breast cancer is hereditary. Most cases of hereditary breast cancer are associated with BRCA1 and BRCA2 genes, although there are other genes associated with hereditary breast cancer as well. Cancer risks and management strategies are gene specific. We discussed that genetic testing can beneficial for several reasons, including clarifying specific cancer risks, identifying potential screening and risk-reduction options that may be appropriate, and to understand if other family members could be at risk for cancer and allow them to undergo genetic testing to clarify their cancer risks.   We reviewed the characteristics, features and inheritance patterns of  hereditary cancer syndromes. We also discussed genetic testing, including the appropriate family members to test, the process of testing, insurance coverage and turn-around-time for results. We discussed the implications of a negative, positive, carrier and/or variant of uncertain significant result.   Katelyn Rich  was offered the [facility] BRCAPlus gene panel which includes sequencing and rearrangement analysis for the following 13 genes: ATM, BARD1, BRCA1, BRCA2, CDH1, CHEK2, NF1, PALB2, PTEN, RAD51C, RAD51D, STK11 and TP53 (sequencing and deletion/duplication).   Katelyn Rich was offered the [facility] CancerNext + RNAinsight gene panel which includes sequencing, rearrangement analysis, and RNA analysis for the following 40 genes: APC, ATM, BAP1, BARD1, BMPR1A, BRCA1, BRCA2, BRIP1, CDH1, CDKN2A, CHEK2, FH, FLCN, MET, MLH1, MSH2, MSH6, MUTYH, NF1, NTHL1, PALB2, PMS2, PTEN, RAD51C, RAD51D, RPS20, SMAD4, STK11, TP53, TSC1, TSC2, and VHL (sequencing and deletion/duplication); AXIN2, HOXB13, MBD4, MSH3, POLD1 and POLE (sequencing only); EPCAM and GREM1 (deletion/duplication only).   Katelyn Rich was also offered the [facility] CancerNext-Expanded + RNAinsight gene panel which includes sequencing, rearrangement, and RNA  analysis for the following 77 genes: AIP, ALK, APC, ATM, AXIN2, BAP1, BARD1, BMPR1A, BRCA1, BRCA2, BRIP1, CDC73, CDH1, CDK4, CDKN1B, CDKN2A, CEBPA, CHEK2, CTNNA1, DDX41, DICER1, ETV6, FH, FLCN, GATA2, LZTR1, MAX, MBD4, MEN1, MET, MLH1, MSH2, MSH3, MSH6, MUTYH, NF1, NF2, NTHL1, PALB2, PHOX2B, PMS2, POT1, PRKAR1A, PTCH1, PTEN, RAD51C, RAD51D, RB1, RET, RPS20, RUNX1, SDHA, SDHAF2, SDHB, SDHC, SDHD, SMAD4, SMARCA4, SMARCB1, SMARCE1, STK11, SUFU, TMEM127, TP53, TSC1, TSC2, VHL, and WT1 (sequencing and deletion/duplication); EGFR, HOXB13, KIT, MITF, PDGFRA, POLD1, and POLE (sequencing only); EPCAM and GREM1 (deletion/duplication only).   Katelyn Rich was informed of the benefits and limitations of each panel, including that expanded pan-cancer panels contain genes may not have clear management guidelines at this point in time. We also discussed that as the number of genes included on a panel increases, the chances of variants of uncertain significance increases. We discussed that if she were to pursue genetic testing today, we would run the [facility] BRCAplus 13 gene panel concurrently with the comprehensive (40 or 77 gene) panel of her choosing.   Based on Katelyn Rich's personal and family history of cancer, she meets medical criteria for genetic testing. she meets criteria due to having 3 or more relatives on the same side of the family with breast cancer including herself. Despite that she meets criteria, she may still have an out of pocket cost. We discussed that if her out of pocket cost for testing is over $100, the laboratory will call and confirm whether she wants to proceed with testing.  If the out of pocket cost of testing is less than $100 she will be billed by the genetic testing laboratory.   PLAN: Despite our recommendation, Katelyn Rich did not wish to pursue genetic testing at today's visit. We understand this decision and remain available to coordinate genetic testing at any time in the future.  We, therefore, recommend Katelyn Rich continue to follow the cancer screening guidelines given by her primary healthcare provider.  RESOURCES PROVIDED:  Katelyn Rich was provided with the following:  [facility] Contact card   Lastly, we encouraged Katelyn Rich to remain in contact with cancer genetics annually so that we can continuously update the family history and inform her of any changes in cancer genetics and testing that may be of benefit for this family.   Katelyn Rich questions were answered to her satisfaction today. Our contact information was provided should additional questions or concerns arise. Thank you for  the referral and allowing us  to share in the care of your patient.   Redmond Whittley R. Bluford, MS, [facility] Certified [email or web address] phone: [phone]  I personally spent a total of 20 minutes in the care of the patient today including preparing to see the patient, getting/reviewing separately obtained history, counseling and educating, referring and communicating with other health care professionals, and documenting clinical information in the EHR.  The patient brought her sister and two other family relatives. Drs. Lanny Stalls, and/or Gudena were available for questions, if needed.   _______________________________________________________________________ For Office Staff:  Number of people involved in session: 4 Was an Intern/ student involved with case: no

## 2024-08-19 ENCOUNTER — Other Ambulatory Visit: Payer: Self-pay | Admitting: Surgery

## 2024-08-19 DIAGNOSIS — C50911 Malignant neoplasm of unspecified site of right female breast: Secondary | ICD-10-CM

## 2024-08-20 ENCOUNTER — Telehealth: Payer: Self-pay

## 2024-08-20 DIAGNOSIS — C50411 Malignant neoplasm of upper-outer quadrant of right female breast: Secondary | ICD-10-CM

## 2024-08-20 NOTE — Telephone Encounter (Signed)
 [employer] 2021-05 - Specimen Collection Study to Evaluate Biomarkers in Subjects with Cancer    Katelyn Rich  was contacted by phone at 3:20 PM on 08/20/2024 regarding the participation of the above study.  Pt. stated she is not ready to participate at this time and will call research team if she decides to proceed. Pt understands that the study must be completed prior to any treatment, as her surgery is scheduled for 09/24/2024.  Pt does not have questions at this time. Pt was thanked for her time. Pt knows to contact research team any time with research-related questions/concerns.   Abelardo Jock Clinical Research Coordinator [phone] 08/20/2024 3:25 PM

## 2024-08-21 ENCOUNTER — Encounter: Payer: Self-pay | Admitting: *Deleted

## 2024-08-21 ENCOUNTER — Telehealth: Payer: Self-pay | Admitting: *Deleted

## 2024-08-21 ENCOUNTER — Telehealth: Payer: Self-pay | Admitting: Hematology and Oncology

## 2024-08-21 NOTE — Telephone Encounter (Signed)
 Spoke with patient to follow up for [facility] and assess navigation needs. Patient denies any questions or concerns at this time.  Encouraged her to call should anything arise. Patient verbalized understanding.

## 2024-08-21 NOTE — Telephone Encounter (Signed)
 I spoke with patient to schedule post op appointment on 10/14/2024. Patient aware of date/time.

## 2024-09-16 NOTE — Progress Notes (Signed)
 "   Medicare AWV  Katelyn Rich is a 68 y.o. female who presents for her subsequent annual wellness visit for Medicare.  Clinical documentation was reviewed and is accessible via encounter-level attachments.   Subjective   Patient ID:  Katelyn Rich is a 68 y.o. (DOB 18-May-1956) female.      Patient presents with   Establish Care    Pt presents today to establish care, pt is fasting, no concerns,    Dental: UTD Eye: UTD Tobacco  Tobacco Use: Low Risk (09/16/2024)   Patient History    Smoking Tobacco Use: Never    Smokeless Tobacco Use: Never    Passive Exposure: Never  Recent Concern: Tobacco Use - Medium Risk (08/13/2024)   Received from [facility] [place]   Patient History    Smoking Tobacco Use: Former    Smokeless Tobacco Use: Never    Passive Exposure: Not on file   Alcohol couple week Diet tries to watch consumption Exercise gym Mood takes xanax rarely. Does not need at present Sleep feels rested 8 hrs Skin sees dermatology  Vaccines TDAP, Covid, Flu, Zoster Care Gaps DEXA, colonoscopy Concerns HTN. BP got too low on HCTZ with dizziness HLD. Not taking medicine Anxiety takes occasional xanax. Some months doesn't take at all Breast Ca surgery in cancer. Stage 1 lumpectomy. No chemo/radiation Knee pain. Takes occasional tramadol  Short Social History[1] Family History[2] M deceased alzheimers, breast ca F deceased cirrhosis Past Medical History:  Diagnosis Date   Breast cancer (*)    Review of Systems is complete and negative except as noted.  Objective   BP 142/86 (BP Location: Left Upper Arm, Patient Position: Sitting)   Pulse 70   Temp 98.3 F (36.8 C) (Oral)   Resp 17   Ht 5' 5.5 (1.664 m)   Wt 169 lb 6.4 oz (76.8 kg)   SpO2 99%   BMI 27.76 kg/m  General:  Well developed, Well nourished, No distress HEENT: Normocephalic, atraumatic; Pupils equal, round, reactive to light and accommodation;  Conjunctiva normal; Bilateral external auditory canals and tympanic membranes normal; Nares normal; Oropharynx moist and clear Neck:  Supple No lymphadenopathy  CV:  S1S2, Regular rate and rhythm, without murmur, gallops or rubs Lungs:  Clear to auscultation bilaterally with normal effort Skin:  No focal rashes  Extremities:  No clubbing, cyanosis or edema.   Neuro:  No focal deficits; Cranial nerves II-XII intact     Impression   1. Medicare annual wellness visit, subsequent   2. Malignant neoplasm of upper-outer quadrant of right breast in female, estrogen receptor positive (*)   3. Hyperlipidemia, unspecified hyperlipidemia type   4. Hypertension, unspecified type   5. Generalized anxiety disorder   6. Screen for colon cancer     Plan  Reviewed Follow up for surgery as scheduled Check labs Home BP log. Continue meds Continue rare prn use Cologuard. Consider GI referral.    Patient's Medications       * Accurate as of September 16, 2024  8:55 AM. Reflects encounter med changes as of last refresh          Continued Medications      Instructions  ALPRAZolam 0.5 mg tablet Commonly known as: XANAX  0.5 mg, At bedtime as needed   Cholecalciferol 125 MCG (5000 UT) Tabs  Take by mouth.   diclofenac sodium 1 % gel Commonly known as: VOLTAREN  2 g, Topical, 4 times daily   magnesium  oxide 400 Tabs Commonly known as: MAG-OX  Take by mouth.   olmesartan medoxomil 40 mg tablet Commonly known as: BENICAR  1 tablet, Daily   Omega 3 1000 MG Caps  1,000 mg, Daily   traMADol 50 mg tablet Commonly known as: ULTRAM  1 tablet, Every 8 hours as needed       Discontinued Medications    amLODIPine besylate 2.5 mg tablet Commonly known as: NORVASC Stopped by: Lamar Beam   ezetimibe 10 MG tablet Commonly known as: ZETIA Stopped by: Lamar Beam   hydroCHLOROthiazide 12.5 mg tablet Stopped by: Lamar Cornet   HYDROcodone-chlorpheniramine ER 10-8 mg/5 mL Suer 12  hr suspension Commonly known as: TUSSIONEX Stopped by: Lamar Cornet        Orders Placed This Encounter  Procedures   Cologuard Stool   CBC And Differential   Comprehensive Metabolic Panel   Lipid Panel    Risks, benefits, and alternatives of the medications and treatment plan prescribed today were discussed, and patient expressed understanding. Plan follow-up as discussed or as needed if any worsening symptoms or change in condition.         Medicare AWV  Katelyn Rich is a 68 y.o. female who presents for her subsequent annual wellness visit for Medicare.  Clinical documentation was reviewed and is accessible via encounter-level attachments.   reviewed Any physical exam components or additional concerns beyond the scope of the Annual Wellness Visit may be documented in a separate note within this encounter.  Medicare Required Components     Reviewed and updated this visit by provider: Tobacco  Allergies  Meds  Problems  Med Hx  Surg Hx  Fam Hx  PDMP       Substance Use Disorder Risk Statement: Low Substance Use Disorder / Overdose risk - Risk factors were reviewed and patient is low risk   Patient Care Team: Lamar MARLA Cornet, MD as PCP - General (Family Medicine)  Vitals   Vitals:   09/16/24 0821  BP: 142/86  Patient Position: Sitting  Pulse: 70  Temp: 98.3 F (36.8 C)  TempSrc: Oral  Resp: 17  Height: 5' 5.5 (1.664 m)  Weight: 169 lb 6.4 oz (76.8 kg)  SpO2: 99%  BMI (Calculated): 27.8    Disposition   1. Medicare annual wellness visit, subsequent (Primary) 2. Malignant neoplasm of upper-outer quadrant of right breast in female, estrogen receptor positive (*) 3. Hyperlipidemia, unspecified hyperlipidemia type -     Lipid Panel; Future; Expected date: 10/28/2024 4. Hypertension, unspecified type -     CBC And Differential; Future; Expected date: 09/16/2024 -     Comprehensive Metabolic Panel; Future 5. Generalized anxiety disorder 6.  Screen for colon cancer -     Cologuard Stool   No follow-ups on file.   Health maintenance issues were discussed with the patient.  A written plan was provided to the patient in the form of patient instructions in the After Visit Summary document.          [1] Social History Tobacco Use   Smoking status: Never    Passive exposure: Never   Smokeless tobacco: Never  Vaping Use   Vaping status: Never Used  Substance Use Topics   Alcohol use: Yes    Alcohol/week: 2.0 - 3.0 standard drinks of alcohol    Types: 2 - 3 Cans of beer per week    Comment: 2-3 beers weekly   Drug use: Never  [2] Family History Problem Relation Name Age  of Onset   No Known Problems Father      Mother    *Some images could not be shown."

## 2024-09-17 ENCOUNTER — Other Ambulatory Visit: Payer: Self-pay

## 2024-09-17 ENCOUNTER — Encounter (HOSPITAL_BASED_OUTPATIENT_CLINIC_OR_DEPARTMENT_OTHER): Payer: Self-pay | Admitting: Surgery

## 2024-09-17 NOTE — Progress Notes (Signed)
" °   09/17/24 1302  PAT Phone Screen  Is the patient taking a GLP-1 receptor agonist? No  Do You Have Diabetes? No  Do You Have Hypertension? Yes  Have You Ever Been to the ER for Asthma? No  Have You Taken Oral Steroids in the Past 3 Months? No  Do you Take Phenteramine or any Other Diet Drugs? No  Recent  Lab Work, EKG, CXR? (S)  Yes  Where was this test performed? (S)  [facility] CMP  Do you have a history of heart problems? No  Any Recent Hospitalizations? No  Height 5' 5.5 (1.664 m)  Weight 76.7 kg  Pat Appointment Scheduled (S)  Yes (Ensure and soap)    "

## 2024-09-23 ENCOUNTER — Ambulatory Visit
Admission: RE | Admit: 2024-09-23 | Discharge: 2024-09-23 | Disposition: A | Discharge status: Home / Self Care | Source: Ambulatory Visit | Attending: Surgery | Admitting: Surgery

## 2024-09-23 DIAGNOSIS — R928 Other abnormal and inconclusive findings on diagnostic imaging of breast: Secondary | ICD-10-CM

## 2024-09-23 DIAGNOSIS — C50911 Malignant neoplasm of unspecified site of right female breast: Secondary | ICD-10-CM

## 2024-09-23 HISTORY — PX: BREAST BIOPSY: SHX20

## 2024-09-23 MED ORDER — CHLORHEXIDINE GLUCONATE CLOTH 2 % EX PADS: 6.0000 | MEDICATED_PAD | Freq: Once | CUTANEOUS | Status: DC

## 2024-09-23 NOTE — Progress Notes (Signed)

## 2024-09-24 ENCOUNTER — Encounter (HOSPITAL_BASED_OUTPATIENT_CLINIC_OR_DEPARTMENT_OTHER): Admission: RE | Disposition: A | Payer: Self-pay | Source: Home / Self Care | Attending: Surgery

## 2024-09-24 ENCOUNTER — Ambulatory Visit (HOSPITAL_BASED_OUTPATIENT_CLINIC_OR_DEPARTMENT_OTHER)
Admission: RE | Admit: 2024-09-24 | Discharge: 2024-09-24 | Disposition: A | Discharge status: Home / Self Care | Attending: Surgery | Admitting: Surgery

## 2024-09-24 ENCOUNTER — Encounter (HOSPITAL_BASED_OUTPATIENT_CLINIC_OR_DEPARTMENT_OTHER): Payer: Self-pay | Admitting: Surgery

## 2024-09-24 ENCOUNTER — Ambulatory Visit (HOSPITAL_BASED_OUTPATIENT_CLINIC_OR_DEPARTMENT_OTHER): Admitting: Anesthesiology

## 2024-09-24 ENCOUNTER — Ambulatory Visit
Admission: RE | Admit: 2024-09-24 | Discharge: 2024-09-24 | Disposition: A | Discharge status: Home / Self Care | Source: Ambulatory Visit | Attending: Surgery | Admitting: Surgery

## 2024-09-24 ENCOUNTER — Other Ambulatory Visit: Payer: Self-pay

## 2024-09-24 DIAGNOSIS — Z79899 Other long term (current) drug therapy: Secondary | ICD-10-CM | POA: Diagnosis not present

## 2024-09-24 DIAGNOSIS — I10 Essential (primary) hypertension: Secondary | ICD-10-CM | POA: Insufficient documentation

## 2024-09-24 DIAGNOSIS — Z170 Estrogen receptor positive status [ER+]: Secondary | ICD-10-CM | POA: Diagnosis not present

## 2024-09-24 DIAGNOSIS — Z1721 Progesterone receptor positive status: Secondary | ICD-10-CM | POA: Insufficient documentation

## 2024-09-24 DIAGNOSIS — F419 Anxiety disorder, unspecified: Secondary | ICD-10-CM | POA: Insufficient documentation

## 2024-09-24 DIAGNOSIS — C50911 Malignant neoplasm of unspecified site of right female breast: Secondary | ICD-10-CM

## 2024-09-24 DIAGNOSIS — Z1732 Human epidermal growth factor receptor 2 negative status: Secondary | ICD-10-CM | POA: Insufficient documentation

## 2024-09-24 DIAGNOSIS — C50411 Malignant neoplasm of upper-outer quadrant of right female breast: Secondary | ICD-10-CM | POA: Insufficient documentation

## 2024-09-24 DIAGNOSIS — Z87891 Personal history of nicotine dependence: Secondary | ICD-10-CM | POA: Insufficient documentation

## 2024-09-24 DIAGNOSIS — Z803 Family history of malignant neoplasm of breast: Secondary | ICD-10-CM | POA: Diagnosis not present

## 2024-09-24 HISTORY — PX: BREAST LUMPECTOMY WITH RADIOACTIVE SEED LOCALIZATION: SHX6424

## 2024-09-24 HISTORY — DX: Malignant (primary) neoplasm, unspecified: C80.1

## 2024-09-24 HISTORY — DX: Anxiety disorder, unspecified: F41.9

## 2024-09-24 HISTORY — DX: Hyperlipidemia, unspecified: E78.5

## 2024-09-24 MED ORDER — EPHEDRINE 5 MG/ML INJ
INTRAVENOUS | Status: AC
  Filled 2024-09-24: qty 5

## 2024-09-24 MED ORDER — OXYCODONE HCL 5 MG PO TABS
ORAL_TABLET | ORAL | Status: AC
  Filled 2024-09-24: qty 1

## 2024-09-24 MED ORDER — FENTANYL CITRATE (PF) 100 MCG/2ML IJ SOLN
25.0000 ug | INTRAMUSCULAR | Status: DC | PRN
  Administered 2024-09-24 (×3): 50 ug via INTRAVENOUS

## 2024-09-24 MED ORDER — DEXAMETHASONE SOD PHOSPHATE PF 10 MG/ML IJ SOLN
INTRAMUSCULAR | Status: AC
  Filled 2024-09-24: qty 3

## 2024-09-24 MED ORDER — KETOROLAC TROMETHAMINE 30 MG/ML IJ SOLN
INTRAMUSCULAR | Status: DC | PRN
  Administered 2024-09-24: 15 mg via INTRAVENOUS

## 2024-09-24 MED ORDER — ONDANSETRON HCL 4 MG/2ML IJ SOLN: 4.0000 mg | Freq: Once | INTRAMUSCULAR | Status: DC | PRN

## 2024-09-24 MED ORDER — LIDOCAINE HCL (CARDIAC) PF 100 MG/5ML IV SOSY
PREFILLED_SYRINGE | INTRAVENOUS | Status: DC | PRN
  Administered 2024-09-24: 60 mg via INTRAVENOUS

## 2024-09-24 MED ORDER — CLINDAMYCIN PHOSPHATE 900 MG/50ML IV SOLN
900.0000 mg | INTRAVENOUS | Status: AC
  Administered 2024-09-24: 900 mg via INTRAVENOUS

## 2024-09-24 MED ORDER — PHENYLEPHRINE 80 MCG/ML (10ML) SYRINGE FOR IV PUSH (FOR BLOOD PRESSURE SUPPORT)
PREFILLED_SYRINGE | INTRAVENOUS | Status: AC
  Filled 2024-09-24: qty 10

## 2024-09-24 MED ORDER — CLINDAMYCIN PHOSPHATE 900 MG/50ML IV SOLN
INTRAVENOUS | Status: AC
  Filled 2024-09-24: qty 50

## 2024-09-24 MED ORDER — DEXAMETHASONE SODIUM PHOSPHATE 4 MG/ML IJ SOLN
INTRAMUSCULAR | Status: DC | PRN
  Administered 2024-09-24: 8 mg via INTRAVENOUS

## 2024-09-24 MED ORDER — ACETAMINOPHEN 500 MG PO TABS
ORAL_TABLET | ORAL | Status: AC
  Filled 2024-09-24: qty 2

## 2024-09-24 MED ORDER — PROPOFOL 10 MG/ML IV BOLUS
INTRAVENOUS | Status: DC | PRN
  Administered 2024-09-24: 140 mg via INTRAVENOUS

## 2024-09-24 MED ORDER — FENTANYL CITRATE (PF) 100 MCG/2ML IJ SOLN
INTRAMUSCULAR | Status: AC
  Filled 2024-09-24: qty 2

## 2024-09-24 MED ORDER — BUPIVACAINE-EPINEPHRINE (PF) 0.25% -1:200000 IJ SOLN
INTRAMUSCULAR | Status: DC | PRN
  Administered 2024-09-24: 18 mL

## 2024-09-24 MED ORDER — ONDANSETRON HCL 4 MG/2ML IJ SOLN
INTRAMUSCULAR | Status: AC
  Filled 2024-09-24: qty 6

## 2024-09-24 MED ORDER — FENTANYL CITRATE (PF) 100 MCG/2ML IJ SOLN
INTRAMUSCULAR | Status: DC | PRN
  Administered 2024-09-24 (×2): 50 ug via INTRAVENOUS

## 2024-09-24 MED ORDER — OXYCODONE HCL 5 MG PO TABS
5.0000 mg | ORAL_TABLET | Freq: Once | ORAL | Status: AC | PRN
  Administered 2024-09-24: 5 mg via ORAL

## 2024-09-24 MED ORDER — 0.9 % SODIUM CHLORIDE (POUR BTL) OPTIME
TOPICAL | Status: DC | PRN
  Administered 2024-09-24: 1000 mL

## 2024-09-24 MED ORDER — EPHEDRINE SULFATE (PRESSORS) 25 MG/5ML IV SOSY
PREFILLED_SYRINGE | INTRAVENOUS | Status: DC | PRN
  Administered 2024-09-24: 10 mg via INTRAVENOUS

## 2024-09-24 MED ORDER — DEXMEDETOMIDINE HCL IN NACL 80 MCG/20ML IV SOLN
INTRAVENOUS | Status: DC | PRN
  Administered 2024-09-24: 8 ug via INTRAVENOUS

## 2024-09-24 MED ORDER — ONDANSETRON HCL 4 MG/2ML IJ SOLN
INTRAMUSCULAR | Status: DC | PRN
  Administered 2024-09-24: 4 mg via INTRAVENOUS

## 2024-09-24 MED ORDER — OXYCODONE HCL 5 MG PO TABS: 5.0000 mg | ORAL_TABLET | Freq: Four times a day (QID) | ORAL | 0 refills | Status: AC | PRN

## 2024-09-24 MED ORDER — KETOROLAC TROMETHAMINE 30 MG/ML IJ SOLN
INTRAMUSCULAR | Status: AC
  Filled 2024-09-24: qty 1

## 2024-09-24 MED ORDER — LACTATED RINGERS IV SOLN: INTRAVENOUS | Status: DC

## 2024-09-24 MED ORDER — ACETAMINOPHEN 500 MG PO TABS
1000.0000 mg | ORAL_TABLET | Freq: Once | ORAL | Status: AC
  Administered 2024-09-24: 1000 mg via ORAL

## 2024-09-24 NOTE — Interval H&P Note (Signed)
 History and Physical Interval Note:  09/24/2024 11:48 AM  Katelyn Rich  has presented today for surgery, with the diagnosis of BREAST CANCER.  The various methods of treatment have been discussed with the patient and family. After consideration of risks, benefits and other options for treatment, the patient has consented to  Procedures: BREAST LUMPECTOMY WITH RADIOACTIVE SEED LOCALIZATION (Right) as a surgical intervention.  The patient's history has been reviewed, patient examined, no change in status, stable for surgery.  I have reviewed the patient's chart and labs.  Questions were answered to the patient's satisfaction.   The procedure has been discussed with the patient. Alternatives to surgery have been discussed with the patient.  Risks of surgery include bleeding,  Infection,  Seroma formation, death,  and the need for further surgery.   The patient understands and wishes to proceed.   Maven Rosander A Sachit Gilman

## 2024-09-24 NOTE — Op Note (Signed)
 Preoperative diagnosis: Stage I right breast cancer upper outer quadrant ER positive  Postoperative diagnosis: Same  Procedure: Right breast seed localized lumpectomy  Surgeon: Debby Shipper, MD  Anesthesia: LMA with 0.25% Marcaine  with epinephrine   EBL: Minimal  Specimen: Right breast tissue with seed clip verified by Faxitron oriented with ink  Drains: None  Indications for procedure: The patient is a 69 year old female with stage I right breast cancer.  She is opted for breast conserving surgery.The procedure has been discussed with the patient. Alternatives to surgery have been discussed with the patient.  Risks of surgery include bleeding,  Infection,  Seroma formation, death,  and the need for further surgery.   The patient understands and wishes to proceed.     Description of procedure: The patient was met in the holding area and questions were answered.  She was taken back to the op room placed supine upon the operative table.  After induction of general anesthesia, right breast was prepped and draped in sterile fashion and timeout performed.  Neoprobe was used to identify the seed in the right breast upper outer quadrant.  A curvilinear incision was made after infiltration skin with local anesthetic.  Dissection was carried down in all tissue and the seed and clip were excised with grossly negative margins.  Imaging revealed both seed and the 2 biopsy clips that were present in the specimen.  The cavities irrigated.  Is made hemostatic with cautery.  Clips used to mark the cavity.  The deep tissue planes were approximated 3-0 Vicryl.  4-0 Monocryl was used to close the skin.  Dermabond applied.  Breast binder placed.  All counts found to be correct.  The patient was awoke extubated taken to recovery in satisfactory condition.

## 2024-09-24 NOTE — H&P (Signed)
 History of Present Illness: Katelyn Rich is a 69 y.o. female who is seen today as an office consultation for evaluation of Breast Cancer  69 year old female seen today in the [facility] for newly diagnosed right breast cancer. 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 She has no other complaints today.  Review of Systems: A complete review of systems was obtained from the patient. I have reviewed this information and discussed as appropriate with the patient. See HPI as well for other ROS.    Medical History: Past Medical History:  Diagnosis Date  Anxiety  Hypertension   Patient Active Problem List  Diagnosis  Malignant neoplasm of upper-outer quadrant of right breast in female, estrogen receptor positive (CMS/HHS-HCC)  Anxiousness  Nervousness   Past Surgical History:  Procedure Laterality Date  .brain surgery N/A 07/30/2007  Breast biopsy (Right) Right 08/04/2024  Breast biopsy (Right) Right 08/04/2024  Anterior cruciate ligament (acl) revision N/A  Date Unknown  CATARACT EXTRACTION W/ INTRAOCULAR LENS IMPLANT & ANTERIOR VITRECTOMY, BILATERAL Bilateral  Date Unknown  CHOLECYSTECTOMY    Allergies  Allergen Reactions  Penicillins Anaphylaxis and Other (See Comments)   Current Outpatient Medications on File Prior to Visit  Medication Sig Dispense Refill  ALPRAZolam (XANAX) 0.5 MG tablet Take 0.5 mg by mouth at bedtime as needed  amLODIPine (NORVASC) 2.5 MG tablet Take 2.5 mg by mouth once daily  diclofenac (VOLTAREN) 1 % topical gel Apply 2 g topically 4 (four) times daily  ezetimibe (ZETIA) 10 mg tablet Take 10 mg by mouth once daily  hydroCHLOROthiazide (HYDRODIURIL) 12.5 MG tablet Take 12.5 mg by mouth once daily  HYDROcodone-chlorpheniramine (TUSSIONEX) 10-8 mg/5 mL ER suspension Take 5 mLs by mouth every 12 (twelve) hours as needed for Cough FOR 12 DAYS  olmesartan (BENICAR) 40 MG tablet Take 40  mg by mouth once daily  traMADoL (ULTRAM) 50 mg tablet Take 50 mg by mouth every 8 (eight) hours as needed   No current facility-administered medications on file prior to visit.   Family History  Problem Relation Age of Onset  Breast cancer Mother    Social History   Tobacco Use  Smoking Status Former  Types: Cigarettes  Smokeless Tobacco Never    Social History   Socioeconomic History  Marital status: Married  Tobacco Use  Smoking status: Former  Types: Cigarettes  Smokeless tobacco: Never  Vaping Use  Vaping status: Never Used  Substance and Sexual Activity  Drug use: Never   Social Drivers of Architectural Technologist Insecurity: No Food Insecurity (08/08/2024)  Received from [facility]  Hunger Vital Sign  Within the past 12 months, you worried that your food would run out before you got the money to buy more.: Never true  Within the past 12 months, the food you bought just didn't last and you didn't have money to get more.: Never true  Transportation Needs: No Transportation Needs (08/08/2024)  Received from [facility] - Transportation  In the past 12 months, has lack of transportation kept you from medical appointments or from getting medications?: No  In the past 12 months, has lack of transportation kept you from meetings, work, or from getting things needed for daily living?: No  Housing Stability: Unknown (08/13/2024)  Housing Stability Vital Sign  Homeless in the Last Year: No   Objective:  There were no vitals filed for this visit.  There is no height or weight on  file to calculate BMI.  Physical Exam Exam conducted with a chaperone present.  Cardiovascular:  Rate and Rhythm: Normal rate.  Pulmonary:  Effort: Pulmonary effort is normal.  Chest:  Breasts: Right: Normal.  Left: Normal.  Musculoskeletal:  Cervical back: Normal range of motion.  Lymphadenopathy:  Upper Body:  Right upper body: No axillary adenopathy.  Left upper body: No  axillary adenopathy.  Skin: General: Skin is warm.  Neurological:  Mental Status: She is alert.     Labs, Imaging and Diagnostic Testing:  FINAL DIAGNOSIS   1. Breast, right, needle core biopsy, 10 o'clock, 5cmfn, ribbon :  INVASIVE DUCTAL CARCINOMA, SEE NOTE  DUCTAL CARCINOMA IN SITU, SOLID, INTERMEDIATE NUCLEAR GRADE, WITHOUT NECROSIS  TUBULE FORMATION: SCORE 2  NUCLEAR PLEOMORPHISM: SCORE 2  MITOTIC COUNT: SCORE 1  TOTAL SCORE: 5  OVERALL GRADE: 1  LYMPHOVASCULAR INVASION: NOT IDENTIFIED  CANCER LENGTH: 0.4 CM  CALCIFICATIONS: NOT IDENTIFIED  OTHER FINDINGS: NONE  SEE NOTE   2. Breast, right, needle core biopsy, 11 o'clock, 2cmfn, coil :  FIBROCYSTIC CHANGES WITH USUAL DUCTAL HYPERPLASIA.  NEGATIVE FOR MALIGNANCY.  SEE NOTE.   Diagnosis Note : Dr.LeGolvan reviewed the case and concurs with the  interpretation.A breast prognostic profile (ER, PR, Ki-67 and HER2) is pending  and will be reported in an addendum.[facility] was notified on  08/05/2024.   ELECTRONIC SIGNATURE : Belvie Come, John, Pathologist, Electronic Signature   MICROSCOPIC DESCRIPTION   CASE COMMENTS  STAINS USED IN DIAGNOSIS:  H&E-2  H&E-3  H&E-4  H&E  *RECUT 1 SLIDE  H&E-2  H&E-3  H&E-4  H&E  Stains used in diagnosis 1 Her2 by IHC, 1 ER-ACIS, 1 KI-67-ACIS, 1 PR-ACIS  IHC scores are reported using ASCO/CAP scoring criteria. An IHC Score of 0 or  1+ is NEGATIVE for HER2, 3+ is POSITIVE for HER2, and 2+ is EQUIVOCAL.  Equivocal results are reflexed to either FISH or IHC testing. Specimens are  fixed in 10% Neutral Buffered Formalin for at least 6 hours and up to 72 hours.  These tests have not be validated on decalcified tissue. Results should be  interpreted with caution given the possibility of false negative results on  decalcified specimens. Antibody Clone for HER2 is 4B5 (PATHWAY). Some of these  immunohistochemical stains may have been developed and the performance   characteristics determined by [facility]. Some may not have been  cleared or approved by the U.S. Food and Drug Administration. The FDA has  determined that such clearance or approval is not necessary. This test is used  for clinical purposes. It should not be regarded as investigational or for  research. This laboratory is certified under the Clinical Laboratory  Improvement Amendments of 1988 (CLIA-88) as qualified to perform high complexity  clinical laboratory testing.  Estrogen receptor (6F11), immunohistochemical stains are performed on formalin  fixed, paraffin embedded tissue using a 3,3-diaminobenzidine (DAB) chromogen  and Leica Bond Autostainer System. The staining intensity of the nucleus is  scored manually and is reported as the percentage of tumor cell nuclei  demonstrating specific nuclear staining.Specimens are fixed in 10% Neutral  Buffered Formalin for at least 6 hours and up to 72 hours. These tests have not  be validated on decalcified tissue. Results should be interpreted with caution  given the possibility of false negative results on decalcified specimens.  Ki-67 (MM1), immunohistochemical stains are performed on formalin fixed,  paraffin embedded tissue using a 3,3-diaminobenzidine (DAB) chromogen and Leica  Bond Autostainer System. The staining intensity of the nucleus is scored  manually and is reported as the percentage of tumor cell nuclei demonstrating  specific nuclear staining.Specimens are fixed in 10% Neutral Buffered Formalin  for at least 6 hours and up to 72 hours. These tests have not be validated on  decalcified tissue. Results should be interpreted with caution given the  possibility of false negative results on decalcified specimens.  PR progesterone receptor (16), immunohistochemical stains are performed on  formalin fixed, paraffin embedded tissue using a 3,3-diaminobenzidine (DAB)  chromogen and Leica Bond Autostainer System. The  staining intensity of the  nucleus is scored manually and is reported as the percentage of tumor cell  nuclei demonstrating specific nuclear staining.Specimens are fixed in 10%  Neutral Buffered Formalin for at least 6 hours and up to 72 hours. These tests  have not be validated on decalcified tissue. Results should be interpreted with  caution given the possibility of false negative results on decalcified  specimens.   ADDENDUM  Breast, right, needle core biopsy, 10 o'clock, 5cmfn, ribbon  PROGNOSTIC INDICATORS   Results:  IMMUNOHISTOCHEMICAL AND MORPHOMETRIC ANALYSIS PERFORMED MANUALLY  The tumor cells are NEGATIVE for Her2 (0).  Estrogen Receptor: 100%, POSITIVE, STRONG STAINING INTENSITY  Progesterone Receptor: 80%, POSITIVE, STRONG STAINING INTENSITY  Proliferation Marker Ki67: 1%  REFERENCE RANGE ESTROGEN RECEPTOR  NEGATIVE 0%  POSITIVE =>1%  REFERENCE RANGE PROGESTERONE RECEPTOR  NEGATIVE 0%  POSITIVE =>1%  All controls stained appropriately  Belvie Come, John, Pathologist, Electronic Signature  ( Signed 11 20 2025)   CLINICAL HISTORY   SPECIMEN(S) OBTAINED  1. Breast, right, needle core biopsy, 10 O'clock, 5cmfn, Ribbon  2. Breast, right, needle core biopsy, 11 O'clock, 2cmfn, Coil   SPECIMEN COMMENTS:  1. TIF: 10 AM, CIT 10 secs; mass  2. TIF: 10:15 AM, CIT 10 secs  SPECIMEN CLINICAL INFORMATION:  1. Expect cancer  2. ? [facility]   Gross Description  1. Received in formalin, TIF 10:00 a.m.CIT 10 seconds, are three cores of soft  tan yellow tissue 1.1 to 1.6 cm in length and each 0.2 cm in diameter.The  specimen is submitted in toto.  2. Received in formalin, TIF 10:15 a.m.CIT 10 seconds are three cores of soft  tan yellow tissue 0.6 to 1.8 cm in length and each 0.2 cm in diameter.The  specimen is submitted in toto.(GP:gt, 08/04/24)   Report signed out from the following location(s)  [facility]  [address] [place], [place] [address] CLIA #:  [id number]   [facility]  [address] [place], [place] [address] CLIA #: [id number]   the upper outer right breast on recent screening mammogram.   EXAM:  DIGITAL DIAGNOSTIC UNILATERAL RIGHT MAMMOGRAM WITH TOMOSYNTHESIS AND  CAD; ULTRASOUND RIGHT BREAST LIMITED   TECHNIQUE:  Right digital diagnostic mammography and breast tomosynthesis was  performed. The images were evaluated with computer-aided detection.  ; Targeted ultrasound examination of the right breast was performed   COMPARISON: Previous exam(s).   ACR Breast Density Category b: There are scattered areas of  fibroglandular density.   FINDINGS:  Right breast mammogram: There is a 5 mm irregular mass with  spiculated borders in the upper outer right breast middle depth 5 cm  from the nipple. There is a second equal density oval 5 mm mass in  the upper outer right breast anterior depth approximately 1 cm from  the nipple. An ultrasound will be performed next for  further  evaluation of both areas.   Targeted ultrasound the upper-outer right breast was performed which  demonstrates in hypoechoic 0.6 x 0.6 x 0.4 cm mass with ill-defined  borders in the right breast at 10 o'clock 5 cm from the nipple. This  corresponds to the spiculated mass on mammography.   In the right breast at 11 o'clock 2 cm from the nipple there is a  slightly irregular 4 x 3 by 5 mm bilobed mass which is part anechoic  part hypoechoic.   Normal appearing lymph nodes in the right axilla.   IMPRESSION:  Highly suspicious 6 mm mass in the right breast 10 o'clock middle  depth 5 cm from the nipple.   Second suspicious part cystic 5 mm mass in the right breast 11  o'clock 2 cm from the nipple.   RECOMMENDATION:  Ultrasound-guided biopsy of both right breast masses is recommended   I have discussed the findings and recommendations with the patient.  If applicable, a reminder letter will be sent to the patient  regarding the  next appointment.   BI-RADS CATEGORY 5: Highly suggestive of malignancy.   Electronically Signed  By: Rosina Gelineau M.D.  On: 08/02/2024 11:29   Assessment and Plan:   Diagnoses and all orders for this visit:  Malignant neoplasm of upper-outer quadrant of right breast in female, estrogen receptor positive (CMS/HHS-HCC)   Reviewed breast conserving surgery versus mastectomy for reconstruction. Discussed omission of lymph node biopsy as well given her advanced age and data pertaining to that with any percent risk of disease. Imaging is negative for axilla which is reinforcing. She wishes to proceed with right breast seed localized lumpectomy.The procedure has been discussed with the patient. Alternatives to surgery have been discussed with the patient. Risks of surgery include bleeding, Infection, Seroma formation, death, and the need for further surgery. The patient understands and wishes to proceed.    DEBBY CURTISTINE SHIPPER, MD

## 2024-09-24 NOTE — Anesthesia Procedure Notes (Signed)
 Procedure Name: LMA Insertion Date/Time: 09/24/2024 12:43 PM  Performed by: Lenard Lacks, CRNAPre-anesthesia Checklist: Patient identified, Emergency Drugs available, Suction available and Patient being monitored Patient Re-evaluated:Patient Re-evaluated prior to induction Oxygen Delivery Method: Circle system utilized Preoxygenation: Pre-oxygenation with 100% oxygen Induction Type: IV induction LMA: LMA inserted Tube size: 3.0 mm Number of attempts: 1 Placement Confirmation: positive ETCO2 and breath sounds checked- equal and bilateral Dental Injury: Teeth and Oropharynx as per pre-operative assessment

## 2024-09-24 NOTE — Anesthesia Preprocedure Evaluation (Addendum)
"                                    Anesthesia Evaluation  Patient identified by MRN, date of birth, ID band Patient awake    Reviewed: Allergy & Precautions, NPO status , Patient's Chart, lab work & pertinent test results  Airway Mallampati: II  TM Distance: >3 FB Neck ROM: Full    Dental  (+) Teeth Intact, Dental Advisory Given   Pulmonary former smoker   Pulmonary exam normal breath sounds clear to auscultation       Cardiovascular hypertension, Pt. on medications (-) angina (-) Past MI Normal cardiovascular exam Rhythm:Regular Rate:Normal     Neuro/Psych  PSYCHIATRIC DISORDERS Anxiety     negative neurological ROS     GI/Hepatic negative GI ROS, Neg liver ROS,,,  Endo/Other  negative endocrine ROS    Renal/GU negative Renal ROS     Musculoskeletal negative musculoskeletal ROS (+)    Abdominal   Peds  Hematology negative hematology ROS (+)   Anesthesia Other Findings Day of surgery medications reviewed with the patient.  BREAST CANCER  Reproductive/Obstetrics                              Anesthesia Physical Anesthesia Plan  ASA: 2  Anesthesia Plan: General   Post-op Pain Management: Tylenol  PO (pre-op)* and Toradol  IV (intra-op)*   Induction: Intravenous  PONV Risk Score and Plan: 3 and Dexamethasone  and Ondansetron   Airway Management Planned: LMA  Additional Equipment:   Intra-op Plan:   Post-operative Plan: Extubation in OR  Informed Consent: I have reviewed the patients History and Physical, chart, labs and discussed the procedure including the risks, benefits and alternatives for the proposed anesthesia with the patient or authorized representative who has indicated his/her understanding and acceptance.     Dental advisory given  Plan Discussed with: CRNA  Anesthesia Plan Comments:          Anesthesia Quick Evaluation  "

## 2024-09-24 NOTE — Transfer of Care (Signed)
 Immediate Anesthesia Transfer of Care Note  Patient: Katelyn Rich  Procedure(s) Performed: BREAST LUMPECTOMY WITH RADIOACTIVE SEED LOCALIZATION (Right: Breast)  Patient Location: PACU  Anesthesia Type:General  Level of Consciousness: awake, alert , and patient cooperative  Airway & Oxygen Therapy: Patient Spontanous Breathing and Patient connected to nasal cannula oxygen  Post-op Assessment: Report given to RN and Post -op Vital signs reviewed and stable  Post vital signs: Reviewed and stable  Last Vitals:  Vitals Value Taken Time  BP    Temp    Pulse 75 09/24/24 13:34  Resp 21 09/24/24 13:34  SpO2 99 % 09/24/24 13:34  Vitals shown include unfiled device data.  Last Pain:  Vitals:   09/24/24 1123  TempSrc: Oral  PainSc: 0-No pain      Patients Stated Pain Goal: 4 (09/24/24 1123)  Complications: No notable events documented.

## 2024-09-24 NOTE — Discharge Instructions (Addendum)
 [facility] Office Phone Number [phone]  BREAST BIOPSY/ PARTIAL MASTECTOMY: POST OP INSTRUCTIONS  Always review your discharge instruction sheet given to you by the facility where your surgery was performed.  IF YOU HAVE DISABILITY OR FAMILY LEAVE FORMS, YOU MUST BRING THEM TO THE OFFICE FOR PROCESSING.  DO NOT GIVE THEM TO YOUR DOCTOR.  A prescription for pain medication may be given to you upon discharge.  Take your pain medication as prescribed, if needed.  If narcotic pain medicine is not needed, then you may take acetaminophen  (Tylenol ) or ibuprofen (Advil) as needed. Take your usually prescribed medications unless otherwise directed If you need a refill on your pain medication, please contact your pharmacy.  They will contact our office to request authorization.  Prescriptions will not be filled after 5pm or on week-ends. You should eat very light the first 24 hours after surgery, such as soup, crackers, pudding, etc.  Resume your normal diet the day after surgery. Most patients will experience some swelling and bruising in the breast.  Ice packs and a good support bra will help.  Swelling and bruising can take several days to resolve.  It is common to experience some constipation if taking pain medication after surgery.  Increasing fluid intake and taking a stool softener will usually help or prevent this problem from occurring.  A mild laxative (Milk of Magnesia or Miralax) should be taken according to package directions if there are no bowel movements after 48 hours. Unless discharge instructions indicate otherwise, you may remove your bandages 24-48 hours after surgery, and you may shower at that time.  You may have steri-strips (small skin tapes) in place directly over the incision.  These strips should be left on the skin for 7-10 days.  If your surgeon used skin glue on the incision, you may shower in 24 hours.  The glue will flake off over the next 2-3 weeks.  Any  sutures or staples will be removed at the office during your follow-up visit. ACTIVITIES:  You may resume regular daily activities (gradually increasing) beginning the next day.  Wearing a good support bra or sports bra minimizes pain and swelling.  You may have sexual intercourse when it is comfortable. You may drive when you no longer are taking prescription pain medication, you can comfortably wear a seatbelt, and you can safely maneuver your car and apply brakes. RETURN TO WORK:  ______________________________________________________________________________________ Katelyn Rich should see your doctor in the office for a follow-up appointment approximately two weeks after your surgery.  Your doctors nurse will typically make your follow-up appointment when she calls you with your pathology report.  Expect your pathology report 2-3 business days after your surgery.  You may call to check if you do not hear from us  after three days. OTHER INSTRUCTIONS: _______________________________________________________________________________________________ _____________________________________________________________________________________________________________________________________ _____________________________________________________________________________________________________________________________________ _____________________________________________________________________________________________________________________________________  WHEN TO CALL YOUR DOCTOR: Fever over 101.0 Nausea and/or vomiting. Extreme swelling or bruising. Continued bleeding from incision. Increased pain, redness, or drainage from the incision.  The clinic staff is available to answer your questions during regular business hours.  Please dont hesitate to call and ask to speak to one of the nurses for clinical concerns.  If you have a medical emergency, go to the nearest emergency room or call 911.  A surgeon from [facility] is always on call at the hospital.  For further questions, please visit [email or web address]    No Tylenol  before 5:30pm today No NSAIDS (Ibuprofen/Motrin/Aleve) before 9:15pm tonight.   Post Anesthesia  Home Care Instructions  Activity: Get plenty of rest for the remainder of the day. A responsible individual must stay with you for 24 hours following the procedure.  For the next 24 hours, DO NOT: -Drive a car -Advertising copywriter -Drink alcoholic beverages -Take any medication unless instructed by your physician -Make any legal decisions or sign important papers.  Meals: Start with liquid foods such as gelatin or soup. Progress to regular foods as tolerated. Avoid greasy, spicy, heavy foods. If nausea and/or vomiting occur, drink only clear liquids until the nausea and/or vomiting subsides. Call your physician if vomiting continues.  Special Instructions/Symptoms: Your throat may feel dry or sore from the anesthesia or the breathing tube placed in your throat during surgery. If this causes discomfort, gargle with warm salt water. The discomfort should disappear within 24 hours.  If you had a scopolamine patch placed behind your ear for the management of post- operative nausea and/or vomiting:  1. The medication in the patch is effective for 72 hours, after which it should be removed.  Wrap patch in a tissue and discard in the trash. Wash hands thoroughly with soap and water. 2. You may remove the patch earlier than 72 hours if you experience unpleasant side effects which may include dry mouth, dizziness or visual disturbances. 3. Avoid touching the patch. Wash your hands with soap and water after contact with the patch.

## 2024-09-24 NOTE — Anesthesia Postprocedure Evaluation (Signed)
"   Anesthesia Post Note  Patient: Katelyn Rich  Procedure(s) Performed: BREAST LUMPECTOMY WITH RADIOACTIVE SEED LOCALIZATION (Right: Breast)     Patient location during evaluation: PACU Anesthesia Type: General Level of consciousness: awake and alert Pain management: pain level controlled Vital Signs Assessment: post-procedure vital signs reviewed and stable Respiratory status: spontaneous breathing, nonlabored ventilation and respiratory function stable Cardiovascular status: blood pressure returned to baseline and stable Postop Assessment: no apparent nausea or vomiting Anesthetic complications: no   No notable events documented.  Last Vitals:  Vitals:   09/24/24 1400 09/24/24 1433  BP: (!) 146/60 133/81  Pulse: 67 64  Resp: 10 18  Temp:  36.5 C  SpO2: 95% 97%    Last Pain:  Vitals:   09/24/24 1433  TempSrc:   PainSc: 6                  [name]      "

## 2024-09-25 ENCOUNTER — Encounter (HOSPITAL_BASED_OUTPATIENT_CLINIC_OR_DEPARTMENT_OTHER): Payer: Self-pay | Admitting: Surgery

## 2024-09-29 LAB — SURGICAL PATHOLOGY

## 2024-09-30 ENCOUNTER — Encounter: Payer: Self-pay | Admitting: *Deleted

## 2024-09-30 DIAGNOSIS — C50411 Malignant neoplasm of upper-outer quadrant of right female breast: Secondary | ICD-10-CM

## 2024-10-09 ENCOUNTER — Encounter: Payer: Self-pay | Admitting: *Deleted

## 2024-10-13 ENCOUNTER — Telehealth: Payer: Self-pay | Admitting: Hematology and Oncology

## 2024-10-13 NOTE — Telephone Encounter (Signed)
 Left the patient a voice,mail with the rescheduled appointment details due to the weather.

## 2024-10-14 ENCOUNTER — Encounter: Payer: Self-pay | Admitting: *Deleted

## 2024-10-14 ENCOUNTER — Inpatient Hospital Stay: Admitting: Hematology and Oncology

## 2024-10-14 ENCOUNTER — Inpatient Hospital Stay: Attending: Hematology and Oncology | Admitting: Hematology and Oncology

## 2024-10-14 VITALS — BP 126/82 | HR 74 | Temp 98.8°F | Resp 17 | Ht 65.0 in | Wt 170.3 lb

## 2024-10-14 DIAGNOSIS — Z803 Family history of malignant neoplasm of breast: Secondary | ICD-10-CM | POA: Insufficient documentation

## 2024-10-14 DIAGNOSIS — C50411 Malignant neoplasm of upper-outer quadrant of right female breast: Secondary | ICD-10-CM | POA: Insufficient documentation

## 2024-10-14 DIAGNOSIS — Z170 Estrogen receptor positive status [ER+]: Secondary | ICD-10-CM | POA: Diagnosis not present

## 2024-10-14 DIAGNOSIS — Z780 Asymptomatic menopausal state: Secondary | ICD-10-CM

## 2024-10-14 DIAGNOSIS — Z13820 Encounter for screening for osteoporosis: Secondary | ICD-10-CM

## 2024-10-14 DIAGNOSIS — Z79811 Long term (current) use of aromatase inhibitors: Secondary | ICD-10-CM | POA: Diagnosis not present

## 2024-10-14 DIAGNOSIS — Z1721 Progesterone receptor positive status: Secondary | ICD-10-CM | POA: Diagnosis not present

## 2024-10-14 MED ORDER — ANASTROZOLE 1 MG PO TABS: 1.0000 mg | ORAL_TABLET | Freq: Every day | ORAL | 3 refills | Status: AC

## 2024-10-14 NOTE — Progress Notes (Signed)
 [facility] CONSULT NOTE  Patient Care Team: Darra Hamilton, PA-C as PCP - General (Physician Assistant) Skeet Juliene SAUNDERS, DO as Consulting Physician (Neurology) Tyree Nanetta SAILOR, RN as Oncology Nurse Navigator Vanderbilt Ned, MD as Consulting Physician (General Surgery) Loretha Ash, MD as Consulting Physician (Hematology and Oncology) Maritza Stagger, MD as Consulting Physician (Radiation Oncology)  CHIEF COMPLAINTS/PURPOSE OF CONSULTATION:  New diagnosis of breast cancer  ASSESSMENT & PLAN:  Assessment & Plan  Estrogen receptor positive right breast cancer, post-surgical 2-3 weeks post-resection of 6 mm, low-grade, ER/PR-positive carcinoma with clear margins. Favorable tumor features, very low proliferation, oncotype will very likely be low. I do not see a role for chemotherapy. Radiation therapy optional, she doesn't want to consider adj radiation.  Adjuvant anti-estrogen therapy indicated to reduce recurrence risk.   - Prescribed anastrozole  1 mg daily for five years, sent prescription to pharmacy. - Advised use of supportive sports bra during the day for several more weeks - Scheduled follow-up in three months to assess anastrozole  tolerance and overall status. - Planned routine surveillance mammogram at next follow-up.  Osteoporosis screening in postmenopausal patient Postmenopausal, initiating aromatase inhibitor therapy, increasing bone loss risk. Bone health monitoring indicated. - Ordered bone density (DEXA) scan within next few months. - Advised to maintain physical activity, ensure adequate vitamin D intake, prioritize dietary calcium over supplements. - Provided education on bone health and dietary sources of calcium and vitamin D. - Instructed that DEXA scan does not need to be completed prior to starting anastrozole .  HISTORY OF PRESENTING ILLNESS:  Katelyn Rich 69 y.o. female is here because of new diagnosis of breast cancer  Discussed the use of  AI scribe software for clinical note transcription with the patient, who gave verbal consent to proceed.  History of Present Illness  Katelyn Rich is a 69 year old female with early-stage, low-grade, ER/PR-positive right breast cancer who presents for post-operative oncology follow-up and initiation of adjuvant anti-estrogen therapy.  She is 2-3 weeks post-lumpectomy for a 6 mm, low-grade, ER/PR-positive right breast tumor. She no longer wears the compression bra. She reports no breast pain, mass, or discharge, and denies new systemic symptoms.  She has no known history of osteopenia or osteoporosis and has not had a recent bone density scan. She maintains an active lifestyle with regular walking and is interested in dietary sources of calcium and vitamin D, expressing concern about the impact of dairy on cancer risk.   She inquired about the use of ivermectin and other alternative therapies for cancer but has not used these agents. She is accompanied by her husband today.  All other systems were reviewed with the patient and are negative.  MEDICAL HISTORY:  Past Medical History:  Diagnosis Date   Anxiety    Cancer [facility])    Family history of breast cancer    Family history of cancer of mouth    Family history of lung cancer    Hyperlipidemia    Hypertension     SURGICAL HISTORY: Past Surgical History:  Procedure Laterality Date   ANTERIOR CRUCIATE LIGAMENT (ACL) REVISION     BREAST BIOPSY Right 08/04/2024   US  RT BREAST BX W LOC DEV 1ST LESION IMG BX SPEC US  GUIDE 08/04/2024 [facility]   BREAST BIOPSY Right 08/04/2024   US  RT BREAST BX W LOC DEV EA ADD LESION IMG BX SPEC US  GUIDE 08/04/2024 [facility]   BREAST BIOPSY  09/23/2024   US  RT RADIOACTIVE SEED LOC 09/23/2024 [facility]  BREAST LUMPECTOMY WITH RADIOACTIVE SEED LOCALIZATION Right 09/24/2024   Procedure: BREAST LUMPECTOMY WITH RADIOACTIVE SEED LOCALIZATION;  Surgeon: Vanderbilt Ned, MD;  Location:  [facility];  Service: General;  Laterality: Right;   CATARACT EXTRACTION, BILATERAL     CHOLECYSTECTOMY      SOCIAL HISTORY: Social History   Socioeconomic History   Marital status: Married    Spouse name: Not on file   Number of children: Not on file   Years of education: Not on file   Highest education level: Not on file  Occupational History   Not on file  Tobacco Use   Smoking status: Former    Types: Cigarettes   Smokeless tobacco: Never  Vaping Use   Vaping status: Never Used  Substance and Sexual Activity   Alcohol use: Yes    Alcohol/week: 4.0 standard drinks of alcohol    Types: 4 Cans of beer per week   Drug use: Never   Sexual activity: Not on file  Other Topics Concern   Not on file  Social History Narrative   Are you right handed or left handed? Right handed   Are you currently employed ? NO    What is your current occupation?    Do you live at home alone? NO   Who lives with you?  Husband    What type of home do you live in: 1 story or 2 story? 1       Social Drivers of Health   Tobacco Use: Medium Risk (09/24/2024)   Patient History    Smoking Tobacco Use: Former    Smokeless Tobacco Use: Never    Passive Exposure: Not on file  Financial Resource Strain: Low Risk (09/16/2024)   Received from [facility]   Overall Financial Resource Strain (CARDIA)    How hard is it for you to pay for the very basics like food, housing, medical care, and heating?: Not very hard  Food Insecurity: No Food Insecurity (09/16/2024)   Received from [facility]   Epic    Within the past 12 months, you worried that your food would run out before you got the money to buy more.: Never true    Within the past 12 months, the food you bought just didn't last and you didn't have money to get more.: Never true  Transportation Needs: No Transportation Needs (09/16/2024)   Received from [facility]   Epic    In the past 12 months, has lack of transportation  kept you from medical appointments or from getting medications?: No    In the past 12 months, has lack of transportation kept you from meetings, work, or from getting things needed for daily living?: No  Physical Activity: Insufficiently Active (09/16/2024)   Received from [facility]   Exercise Vital Sign    On average, how many days per week do you engage in moderate to strenuous exercise (like a brisk walk)?: 3 days    On average, how many minutes do you engage in exercise at this level?: 30 min  Stress: No Stress Concern Present (09/16/2024)   Received from [facility] - Occupational Stress Questionnaire    Do you feel stress - tense, restless, nervous, or anxious, or unable to sleep at night because your mind is troubled all the time - these days?: Not at all  Social Connections: Socially Integrated (09/16/2024)   Received from [facility]   Social Network  How would you rate your social network (family, work, friends)?: Good participation with social networks  Intimate Partner Violence: Not At Risk (09/16/2024)   Received from [facility]   HITS    Over the last 12 months how often did your partner physically hurt you?: Never    Over the last 12 months how often did your partner insult you or talk down to you?: Never    Over the last 12 months how often did your partner threaten you with physical harm?: Never    Over the last 12 months how often did your partner scream or curse at you?: Never  Depression (PHQ2-9): Low Risk (10/14/2024)   Depression (PHQ2-9)    PHQ-2 Score: 0  Alcohol Screen: Not on file  Housing: Low Risk (09/16/2024)   Received from [facility]    In the last 12 months, was there a time when you were not able to pay the mortgage or rent on time?: No    In the past 12 months, how many times have you moved where you were living?: 0    At any time in the past 12 months, were you homeless or living in a  shelter (including now)?: No  Utilities: Not At Risk (09/16/2024)   Received [facility]    In the past 12 months has the electric, gas, oil, or water company threatened to shut off services in your home?: No  Health Literacy: Not on file    FAMILY HISTORY: Family History  Problem Relation Age of Onset   Dementia Mother    Breast cancer Mother 42       lumpectomy   Breast cancer Maternal Aunt 59 - 49   Lung cancer Maternal Aunt        used tobacco   Migraines Daughter    Breast cancer Paternal Cousin 35   Cancer Paternal Cousin        mouth cancer   Cancer Paternal Cousin        GI tract cancer    ALLERGIES:  is allergic to penicillins.  MEDICATIONS:  Current Outpatient Medications  Medication Sig Dispense Refill   ALPRAZolam (XANAX) 0.5 MG tablet Take 0.5 mg by mouth at bedtime as needed.     amLODipine (NORVASC) 2.5 MG tablet Take 1 tablet by mouth daily.     Cholecalciferol (VITAMIN D-3) 125 MCG (5000 UT) TABS Take by mouth.     co-enzyme Q-10 30 MG capsule Take 30 mg by mouth daily.     diclofenac Sodium (VOLTAREN) 1 % GEL Apply 2 g topically 4 (four) times daily.     hydrochlorothiazide (HYDRODIURIL) 12.5 MG tablet Take 12.5 mg by mouth daily.     Magnesium 400 MG TABS Take by mouth.     Misc Natural Products (BEET ROOT PO) Take by mouth.     olmesartan (BENICAR) 40 MG tablet Take 1 tablet by mouth daily.     Omega 3 1000 MG CAPS Take 1,000 mg by mouth daily.     oxyCODONE  (OXY IR/ROXICODONE ) 5 MG immediate release tablet Take 1 tablet (5 mg total) by mouth every 6 (six) hours as needed for severe pain (pain score 7-10). 15 tablet 0   Pyridoxine HCl (VITAMIN B6 PO) Take by mouth.     Red Yeast Rice Extract (RED YEAST RICE PO) Take by mouth.     traMADol (ULTRAM) 50 MG tablet Take 1 tablet by mouth every 8 (eight) hours  as needed.     VITAMIN D PO Take by mouth.     No current facility-administered medications for this visit.     PHYSICAL  EXAMINATION: ECOG PERFORMANCE STATUS: 0 - Asymptomatic  Vitals:   10/14/24 1221  BP: 126/82  Pulse: 74  Resp: 17  Temp: 98.8 F (37.1 C)  SpO2: 96%   Filed Weights   10/14/24 1221  Weight: 170 lb 4.8 oz (77.2 kg)    GENERAL:alert, no distress and comfortable   LABORATORY DATA:  I have reviewed the data as listed Lab Results  Component Value Date   WBC 7.2 08/13/2024   HGB 13.8 08/13/2024   HCT 41.2 08/13/2024   MCV 83.7 08/13/2024   PLT 329 08/13/2024     Chemistry      Component Value Date/Time   NA 135 08/13/2024 1135   K 4.1 08/13/2024 1135   CL 93 (L) 08/13/2024 1135   CO2 29 08/13/2024 1135   BUN 14 08/13/2024 1135   CREATININE 0.76 08/13/2024 1135      Component Value Date/Time   CALCIUM 10.2 08/13/2024 1135   ALKPHOS 56 08/13/2024 1135   AST 30 08/13/2024 1135   ALT 31 08/13/2024 1135   BILITOT 0.9 08/13/2024 1135       RADIOGRAPHIC STUDIES: I have personally reviewed the radiological images as listed and agreed with the findings in the report. MM Breast Surgical Specimen Result Date: 09/24/2024 CLINICAL DATA:  Specimen radiograph. EXAM: SPECIMEN RADIOGRAPH OF THE RIGHT BREAST COMPARISON:  Previous exam(s). FINDINGS: Status post excision of the right breast. The radioactive seed and ribbon biopsy marker clip are present, completely intact, and were marked for pathology. Specimen radiograph also contains a coil shaped clip. IMPRESSION: Specimen radiograph of the right breast. Electronically Signed   By: Harrie Deis M.D.   On: 09/24/2024 16:06   MM CLIP PLACEMENT RIGHT Result Date: 09/23/2024 CLINICAL DATA:  69 year old woman with biopsy-proven invasive ductal carcinoma of the RIGHT breast presented for ultrasound-guided radioactive seed localization prior to lumpectomy. EXAM: DIAGNOSTIC RIGHT MAMMOGRAM POST ULTRASOUND-GUIDED RADIOACTIVE SEED PLACEMENT COMPARISON:  Previous exam(s). ACR Breast Density Category b: There are scattered areas of fibroglandular  density. FINDINGS: Mammographic images were obtained following ultrasound-guided radioactive seed placement. These demonstrate appropriate positioning of the radioactive seed in the outer RIGHT breast. IMPRESSION: Appropriate location of the radioactive seed. Final Assessment: Post Procedure Mammograms for Seed Placement BI-RADS CATEGORY  26M: Post-Procedure Mammogram for Marker Placement Electronically Signed   By: Aliene Lloyd M.D.   On: 09/23/2024 10:21   US  RT RADIOACTIVE SEED LOC Result Date: 09/23/2024 CLINICAL DATA:  69 year old woman with biopsy-proven invasive ductal carcinoma of the RIGHT breast presents for radioactive seed localization prior to lumpectomy. EXAM: ULTRASOUND GUIDED RADIOACTIVE SEED LOCALIZATION OF THE RIGHT BREAST COMPARISON:  Previous exam(s). FINDINGS: Patient presents for radioactive seed localization prior to RIGHT breast lumpectomy. I met with the patient and we discussed the procedure of seed localization including benefits and alternatives. We discussed the high likelihood of a successful procedure. We discussed the risks of the procedure including infection, bleeding, tissue injury and further surgery. We discussed the low dose of radioactivity involved in the procedure. Informed, written consent was given. The usual time-out protocol was performed immediately prior to the procedure. Using ultrasound guidance, sterile technique, 1% lidocaine  and an I-125 radioactive seed, RIGHT breast mass at 10 o'clock 5 CMFN was localized using a lateral approach. The follow-up mammogram images confirm the seed in the expected location and  were marked for Dr. Vanderbilt. Follow-up survey of the patient confirms presence of the radioactive seed. Order number of I-125 seed:  [id number]. Total activity:  0.242 mCi reference Date: 09/03/2024 The patient tolerated the procedure well and was [facility]. She was given instructions regarding seed removal. IMPRESSION: Radioactive seed  localization right breast. No apparent complications. Electronically Signed   By: Aliene Lloyd M.D.   On: 09/23/2024 10:19    All questions were answered. The patient knows to call the clinic with any problems, questions or concerns. I spent 30 minutes in the care of this patient including H and P, review of records, counseling and coordination of care.     Amber Stalls, MD 10/14/2024 12:33 PM

## 2024-10-17 ENCOUNTER — Other Ambulatory Visit: Payer: Self-pay | Admitting: Radiation Oncology

## 2024-10-17 ENCOUNTER — Inpatient Hospital Stay
Admission: RE | Admit: 2024-10-17 | Discharge: 2024-10-17 | Disposition: A | Payer: Self-pay | Source: Ambulatory Visit | Attending: Radiation Oncology

## 2024-10-17 DIAGNOSIS — Z170 Estrogen receptor positive status [ER+]: Secondary | ICD-10-CM

## 2024-10-17 DIAGNOSIS — C50411 Malignant neoplasm of upper-outer quadrant of right female breast: Secondary | ICD-10-CM

## 2024-10-20 ENCOUNTER — Ambulatory Visit: Admitting: Radiation Oncology

## 2024-10-20 ENCOUNTER — Ambulatory Visit

## 2024-10-22 NOTE — Progress Notes (Incomplete)
 " Radiation Oncology         (336) [phone] ________________________________  Name: Katelyn Rich        MRN: [id number]  Date of Service: 10/27/2024 DOB: September 22, 1955  RR:Onwh, Glendia, PA-C  Loretha Ash, MD     REFERRING PHYSICIAN: Loretha Ash, MD   DIAGNOSIS: The encounter diagnosis was Malignant neoplasm of upper-outer quadrant of right breast in female, estrogen receptor positive (HCC). C50.411, Z17.0   HISTORY OF PRESENT ILLNESS: Katelyn Rich is a 69 y.o. female seen in consultation for radiation therapy.  She presented after spiculated mass seen on screening mammogram. Subsequent diagnostic mammogram on 08/02/24 demonstrated a 0.6 x 0.6 x 0.4 cm mass w/ ill defined borders at the 10 o'clock position in the UOQ of the R breast. Additionally, a second suspicious partially cystic 5 mm lesion was identified at the 11 o'clock position. No abnormal lymph nodes were visualized within the R axilla.  US  guided core biopsies were performed on 08/04/24. Pathology from the 10 o'clock lesion revealed invasive ductal carcinoma, grade 1, ER+, PR+ and Her1- with a Ki-67 of 1%. The biopsy of the 11 o'clock lesion was negative and consistent with fibrocystic changes with usual ductal hyperplasia.  The patient subsequently underwent R breast lumpectomy w/ radioactive seed localization on 09/24/24. Final surgical pathology demonstrated invasive carcinoma of no special type measuring 6 mm in greatest dimension, Nottingham grade 1, with associated intermediate-grade ductal carcinoma in situ. There was no LVSI identified. All margins were negative for both invasive carcinoma and DCIS with the closest margin measuring 2 mm inferiorly. No regional lymph nodes were submitted or identified. Pathologic staging was pT1b, pN not applicable, per AJCC 8th edition.   Postoperative specimen radiography confirmed removal of the radioactive seed and biopsy marker clips. The patient tolerated surgery well and now  presents to discuss the role of adjuvant radiation therapy.   She has already started anastrozole  with Dr. Loretha and has stated she favors omission of radiation in setting of her low risk disease.  Estrogen exposure history:  Childbearing/breastfeeding:   Family history of cancer:  PREVIOUS RADIATION THERAPY: {EXAM; YES/NO:19492::No}  AUTOIMMUNE DISEASE: {EXAM; YES/NO:19492::No}  MEDICAL DEVICES: {EXAM; YES/NO:19492::No}  PREGNANCY: {EXAM; YES/NO:19492::No}   PAST MEDICAL HISTORY:  Past Medical History:  Diagnosis Date   Anxiety    Cancer (HCC)    Family history of breast cancer    Family history of cancer of mouth    Family history of lung cancer    Hyperlipidemia    Hypertension        PAST SURGICAL HISTORY: Past Surgical History:  Procedure Laterality Date   ANTERIOR CRUCIATE LIGAMENT (ACL) REVISION     BREAST BIOPSY Right 08/04/2024   US  RT BREAST BX W LOC DEV 1ST LESION IMG BX SPEC US  GUIDE 08/04/2024 [facility]   BREAST BIOPSY Right 08/04/2024   US  RT BREAST BX W LOC DEV EA ADD LESION IMG BX SPEC US  GUIDE 08/04/2024 [facility]   BREAST BIOPSY  09/23/2024   US  RT RADIOACTIVE SEED LOC 09/23/2024 [facility]   BREAST LUMPECTOMY WITH RADIOACTIVE SEED LOCALIZATION Right 09/24/2024   Procedure: BREAST LUMPECTOMY WITH RADIOACTIVE SEED LOCALIZATION;  Surgeon: Vanderbilt Ned, MD;  Location: [facility];  Service: General;  Laterality: Right;   CATARACT EXTRACTION, BILATERAL     CHOLECYSTECTOMY       FAMILY HISTORY:  Family History  Problem Relation Age of Onset   Dementia Mother    Breast cancer Mother 40  lumpectomy   Breast cancer Maternal Aunt 40 - 49   Lung cancer Maternal Aunt        used tobacco   Migraines Daughter    Breast cancer Paternal Cousin 32   Cancer Paternal Cousin        mouth cancer   Cancer Paternal Cousin        GI tract cancer     SOCIAL HISTORY:  reports that she has quit smoking. Her  smoking use included cigarettes. She has never used smokeless tobacco. She reports current alcohol use of about 4.0 standard drinks of alcohol per week. She reports that she does not use drugs.   ALLERGIES: Penicillins   MEDICATIONS:  Current Outpatient Medications  Medication Sig Dispense Refill   ALPRAZolam (XANAX) 0.5 MG tablet Take 0.5 mg by mouth at bedtime as needed.     amLODipine (NORVASC) 2.5 MG tablet Take 1 tablet by mouth daily.     anastrozole  (ARIMIDEX ) 1 MG tablet Take 1 tablet (1 mg total) by mouth daily. 90 tablet 3   Cholecalciferol (VITAMIN D-3) 125 MCG (5000 UT) TABS Take by mouth.     co-enzyme Q-10 30 MG capsule Take 30 mg by mouth daily.     diclofenac Sodium (VOLTAREN) 1 % GEL Apply 2 g topically 4 (four) times daily.     hydrochlorothiazide (HYDRODIURIL) 12.5 MG tablet Take 12.5 mg by mouth daily.     Magnesium 400 MG TABS Take by mouth.     Misc Natural Products (BEET ROOT PO) Take by mouth.     olmesartan (BENICAR) 40 MG tablet Take 1 tablet by mouth daily.     Omega 3 1000 MG CAPS Take 1,000 mg by mouth daily.     oxyCODONE  (OXY IR/ROXICODONE ) 5 MG immediate release tablet Take 1 tablet (5 mg total) by mouth every 6 (six) hours as needed for severe pain (pain score 7-10). 15 tablet 0   Pyridoxine HCl (VITAMIN B6 PO) Take by mouth.     Red Yeast Rice Extract (RED YEAST RICE PO) Take by mouth.     traMADol (ULTRAM) 50 MG tablet Take 1 tablet by mouth every 8 (eight) hours as needed.     VITAMIN D PO Take by mouth.     No current facility-administered medications for this visit.     REVIEW OF SYSTEMS: The patient reports that they are doing well generally. Pertinent ROS per HPI and otherwise negative.      PHYSICAL EXAM:  Wt Readings from Last 3 Encounters:  10/14/24 170 lb 4.8 oz (77.2 kg)  09/24/24 170 lb 10.2 oz (77.4 kg)  08/13/24 170 lb (77.1 kg)   Temp Readings from Last 3 Encounters:  10/14/24 98.8 F (37.1 C)  09/24/24 97.7 F (36.5 C)   08/13/24 99.2 F (37.3 C) (Temporal)   BP Readings from Last 3 Encounters:  10/14/24 126/82  09/24/24 133/81  08/13/24 (!) 140/73   Pulse Readings from Last 3 Encounters:  10/14/24 74  09/24/24 64  08/13/24 89    /10   Physical Exam   Breast Exam:  Well-healed lumpectomy/re-excision scar in the *** quadrant. No erythema, induration, or drainage. No palpable masses or nodularity at the surgical site. No skin changes, nipple inversion, or discharge.   ECOG = ***   LABORATORY DATA:  Lab Results  Component Value Date   WBC 7.2 08/13/2024   HGB 13.8 08/13/2024   HCT 41.2 08/13/2024   MCV 83.7 08/13/2024   PLT 329  08/13/2024   Lab Results  Component Value Date   NA 135 08/13/2024   K 4.1 08/13/2024   CL 93 (L) 08/13/2024   CO2 29 08/13/2024   Lab Results  Component Value Date   ALT 31 08/13/2024   AST 30 08/13/2024   ALKPHOS 56 08/13/2024   BILITOT 0.9 08/13/2024      RADIOGRAPHY: MM Breast Surgical Specimen Result Date: 09/24/2024 CLINICAL DATA:  Specimen radiograph. EXAM: SPECIMEN RADIOGRAPH OF THE RIGHT BREAST COMPARISON:  Previous exam(s). FINDINGS: Status post excision of the right breast. The radioactive seed and ribbon biopsy marker clip are present, completely intact, and were marked for pathology. Specimen radiograph also contains a coil shaped clip. IMPRESSION: Specimen radiograph of the right breast. Electronically Signed   By: Harrie Deis M.D.   On: 09/24/2024 16:06   MM CLIP PLACEMENT RIGHT Result Date: 09/23/2024 CLINICAL DATA:  69 year old woman with biopsy-proven invasive ductal carcinoma of the RIGHT breast presented for ultrasound-guided radioactive seed localization prior to lumpectomy. EXAM: DIAGNOSTIC RIGHT MAMMOGRAM POST ULTRASOUND-GUIDED RADIOACTIVE SEED PLACEMENT COMPARISON:  Previous exam(s). ACR Breast Density Category b: There are scattered areas of fibroglandular density. FINDINGS: Mammographic images were obtained following  ultrasound-guided radioactive seed placement. These demonstrate appropriate positioning of the radioactive seed in the outer RIGHT breast. IMPRESSION: Appropriate location of the radioactive seed. Final Assessment: Post Procedure Mammograms for Seed Placement BI-RADS CATEGORY  47M: Post-Procedure Mammogram for Marker Placement Electronically Signed   By: Aliene Lloyd M.D.   On: 09/23/2024 10:21   US  RT RADIOACTIVE SEED LOC Result Date: 09/23/2024 CLINICAL DATA:  69 year old woman with biopsy-proven invasive ductal carcinoma of the RIGHT breast presents for radioactive seed localization prior to lumpectomy. EXAM: ULTRASOUND GUIDED RADIOACTIVE SEED LOCALIZATION OF THE RIGHT BREAST COMPARISON:  Previous exam(s). FINDINGS: Patient presents for radioactive seed localization prior to RIGHT breast lumpectomy. I met with the patient and we discussed the procedure of seed localization including benefits and alternatives. We discussed the high likelihood of a successful procedure. We discussed the risks of the procedure including infection, bleeding, tissue injury and further surgery. We discussed the low dose of radioactivity involved in the procedure. Informed, written consent was given. The usual time-out protocol was performed immediately prior to the procedure. Using ultrasound guidance, sterile technique, 1% lidocaine  and an I-125 radioactive seed, RIGHT breast mass at 10 o'clock 5 CMFN was localized using a lateral approach. The follow-up mammogram images confirm the seed in the expected location and were marked for Dr. Vanderbilt. Follow-up survey of the patient confirms presence of the radioactive seed. Order number of I-125 seed:  [id number]. Total activity:  0.242 mCi reference Date: 09/03/2024 The patient tolerated the procedure well and was [facility]. She was given instructions regarding seed removal. IMPRESSION: Radioactive seed localization right breast. No apparent complications.  Electronically Signed   By: Aliene Lloyd M.D.   On: 09/23/2024 10:19     PATHOLOGY:   R Breast lumpectomy 09/24/24:  FINAL MICROSCOPIC DIAGNOSIS:  A. BREAST, RIGHT, LUMPECTOMY: - Invasive carcinoma of no special type (ductal). - Ductal carcinoma in situ (DCIS). - See cancer summary below. - Two biopsy sites with corresponding ribbon and coil clips. - Usual ductal hyperplasia. - Radioactive seed.  CASE SUMMARY: INVASIVE CARCINOMA OF THE BREAST Standard(s): AJCC-UICC 8  SPECIMEN Procedure: Lumpectomy Specimen Laterality: Right  TUMOR Histologic Type: Invasive carcinoma of no special type (ductal) Histologic Grade (Nottingham Histologic Score)      Glandular (Acinar)/Tubular Differentiation: 2  Nuclear Pleomorphism: 2      Mitotic Rate: 1      Overall Grade: 1 Tumor Size: Greatest dimension of largest invasive focus: 6 mm Ductal Carcinoma In Situ (DCIS): Present, intermediate grade Lymphatic and/or Vascular Invasion: Not identified Treatment Effect in the Breast: No known presurgical therapy  MARGINS Margin Status for Invasive Carcinoma: All margins negative for invasive carcinoma      Distance from closest margin: 2 mm      Specify closest margin: Inferior  Margin Status for DCIS: All margins negative for DCIS      Distance from DCIS to closest margin: 2 mm      Specify closest margin: Inferior  REGIONAL LYMPH NODES Regional Lymph Node Status: Not applicable (no regional lymph nodes submitted or found)  DISTANT METASTASIS Distant Site(s) Involved, if applicable: Not applicable  PATHOLOGIC STAGE CLASSIFICATION (pTNM, AJCC 8th Edition): pT Category: pT1b pN Category: Not applicable pM Category: Not applicable   SPECIAL STUDIES  Breast Biomarker Testing Performed on Previous Biopsy: [id number]       Estrogen Receptor: Positive (100%, strong)       Progesterone Receptor: Positive (80%, strong)       Proliferation Marker Ki67: 1%       HER2 IHC:  Negative (0)    IMPRESSION/PLAN:   Patient with anatomic and prognostic Stage IA pT1b cN0M0 IDC of the R breast, Grade 1, ER/PR+, Her2-, Ki-67 of 1% who is seen post-operatively for consideration of adjuvant radiation therapy. We discussed the role of breast irradiation in reducing the risk of local recurrence and improving long-term disease control. We also discussed omission of radiation in setting of low risk disease.  We reviewed the logistics of treatment in detail, including the need for a CT simulation for treatment planning, followed by several days for contouring, dosimetry, and quality assurance prior to starting therapy.   The planned course of radiation therapy varies based on patholgoy and patient preference. The decision between a moderately hypofractionated regimen and an ultrahypofractionated regimen will be made based on patient preference after discussion of the risks, benefits, and available data. It was explained that longer-term outcome data are available for moderate hypofractionation; however, based on current evidence, both approaches appear comparable in terms of disease control and toxicity profiles.  We reviewed that each treatment session typically lasts only a few minutes, though positioning and setup may take additional time. The patient should plan to be in the department for approximately 45 minutes per visit. She will be evaluated by me at least once weekly during treatment to monitor for side effects, assess tolerance, and ensure it is safe to continue therapy.  We discussed acute and subacute side effects which are generally gradual in onset and typically include fatigue, skin erythema, tanning or hyperpigmentation, breast edema or firmness and mild tenderness.  Less common but possible side effects include desquamation of the skin, decreased range of motion of the shoulder, and transient changes in breast size and texture.  Long-term risk such as cosmetic changes,  rare risk of rib fracture and cardiopulmonary effects were also reviewed.  Reassured patient that most acute side effects improve over weeks to months after completing therapy.   Patient verbalized understanding of these risks and benefits.  --  Total time spent today in preparation for this visit was *** minutes. This included patient care, imaging and path review, documentation, multidisciplinary discussion and coordination of care and follow up.    Estefana HERO. Maritza, M.D.   "

## 2024-10-27 ENCOUNTER — Ambulatory Visit

## 2024-10-27 ENCOUNTER — Ambulatory Visit: Admitting: Radiation Oncology

## 2024-11-11 ENCOUNTER — Ambulatory Visit: Admitting: Physical Therapy

## 2025-01-26 ENCOUNTER — Inpatient Hospital Stay: Attending: Hematology and Oncology | Admitting: Adult Health

## 2025-01-26 ENCOUNTER — Inpatient Hospital Stay
